# Patient Record
Sex: Female | Born: 1955 | Race: White | Hispanic: No | Marital: Married | Smoking: Never smoker
Health system: Southern US, Community
[De-identification: ages and names within clinical notes are randomized; demographics above are authoritative.]

## PROBLEM LIST (undated history)

## (undated) DIAGNOSIS — M4722 Other spondylosis with radiculopathy, cervical region: Secondary | ICD-10-CM

## (undated) DIAGNOSIS — R0789 Other chest pain: Secondary | ICD-10-CM

## (undated) DIAGNOSIS — IMO0002 Reserved for concepts with insufficient information to code with codable children: Secondary | ICD-10-CM

## (undated) DIAGNOSIS — G43709 Chronic migraine without aura, not intractable, without status migrainosus: Secondary | ICD-10-CM

## (undated) HISTORY — DX: Reserved for concepts with insufficient information to code with codable children: IMO0002

## (undated) HISTORY — DX: Other chest pain: R07.89

## (undated) HISTORY — DX: Other spondylosis with radiculopathy, cervical region: M47.22

## (undated) HISTORY — DX: Chronic migraine without aura, not intractable, without status migrainosus: G43.709

---

## 2003-07-16 ENCOUNTER — Other Ambulatory Visit: Admission: RE | Admit: 2003-07-16 | Discharge: 2003-07-16 | Payer: Self-pay | Admitting: Obstetrics and Gynecology

## 2004-07-20 ENCOUNTER — Other Ambulatory Visit: Admission: RE | Admit: 2004-07-20 | Discharge: 2004-07-20 | Payer: Self-pay | Admitting: Obstetrics and Gynecology

## 2005-05-10 ENCOUNTER — Encounter: Admission: RE | Admit: 2005-05-10 | Discharge: 2005-05-10 | Payer: Self-pay | Admitting: Family Medicine

## 2005-09-13 ENCOUNTER — Other Ambulatory Visit: Admission: RE | Admit: 2005-09-13 | Discharge: 2005-09-13 | Payer: Self-pay | Admitting: Obstetrics and Gynecology

## 2007-07-05 ENCOUNTER — Ambulatory Visit: Payer: Self-pay | Admitting: Specialist

## 2007-07-05 ENCOUNTER — Ambulatory Visit: Payer: Self-pay | Admitting: Critical Care Medicine

## 2007-07-30 ENCOUNTER — Ambulatory Visit: Payer: Self-pay | Admitting: Pulmonary Disease

## 2016-04-30 ENCOUNTER — Encounter (INDEPENDENT_AMBULATORY_CARE_PROVIDER_SITE_OTHER): Payer: Self-pay

## 2016-04-30 DIAGNOSIS — G43709 Chronic migraine without aura, not intractable, without status migrainosus: Secondary | ICD-10-CM | POA: Insufficient documentation

## 2016-04-30 DIAGNOSIS — IMO0002 Reserved for concepts with insufficient information to code with codable children: Secondary | ICD-10-CM | POA: Insufficient documentation

## 2016-07-04 ENCOUNTER — Ambulatory Visit (INDEPENDENT_AMBULATORY_CARE_PROVIDER_SITE_OTHER): Payer: Managed Care, Other (non HMO) | Admitting: Sports Medicine

## 2016-07-04 ENCOUNTER — Encounter (INDEPENDENT_AMBULATORY_CARE_PROVIDER_SITE_OTHER): Payer: Self-pay | Admitting: Sports Medicine

## 2016-07-04 VITALS — BP 133/84 | HR 64

## 2016-07-04 DIAGNOSIS — R030 Elevated blood-pressure reading, without diagnosis of hypertension: Secondary | ICD-10-CM | POA: Diagnosis not present

## 2016-07-04 DIAGNOSIS — M4722 Other spondylosis with radiculopathy, cervical region: Secondary | ICD-10-CM | POA: Diagnosis not present

## 2016-07-04 DIAGNOSIS — M25511 Pain in right shoulder: Secondary | ICD-10-CM | POA: Diagnosis not present

## 2016-07-04 DIAGNOSIS — G8929 Other chronic pain: Secondary | ICD-10-CM

## 2016-07-04 MED ORDER — OXYCODONE-ACETAMINOPHEN 5-325 MG PO TABS: 1.0000 | ORAL_TABLET | Freq: Four times a day (QID) | ORAL | 0 refills | Status: DC | PRN

## 2016-07-04 NOTE — Patient Instructions (Signed)
It was good to see you. Keep up with the exercises. Please check your BP at least once a week and write down the numbers and please bring them with you to your next appointment.

## 2016-07-04 NOTE — Progress Notes (Signed)
   Office Visit Note   Patient: Amanda Mitchell           Date of Birth: [birth year]           MRN: [id number] Visit Date: 07/04/2016              Requested by: No referring provider defined for this encounter. PCP: Juluis MireMCCOMB,JOHN S, MD  Assessment & Plan: Visit Diagnoses:  1. Elevated BP without diagnosis of hypertension   2. Cervical spondylosis with radiculopathy   3. Chronic right shoulder pain    Plan: Meds refilled today. No red flag symptoms. Continue HEP. Home BP checks.   Follow-Up Instructions: Return in about 3 months (around 10/04/2016) for Medication check and refills.   Orders:  Meds ordered this encounter  Medications  . oxyCODONE-acetaminophen (PERCOCET/ROXICET) 5-325 MG tablet    Sig: Take 1 tablet by mouth every 6 (six) hours as needed for severe pain.    Dispense:  60 tablet    Refill:  0    Procedures: No procedures performed   Historical Data: Findings:  Husband - Grayce SessionsMichael Travels extensively for work. Uses neck brace while flying with good control of symptoms.  Cervical Spondylosis multi-level on prior X-rays. No red flags symptoms in the past. Does well on intermittent Percocet - #60 q 3 months. Have discussed that we will not be increasing doses without referral to pain management. Chronic "Hormone Related" Headaches improved on chronic Progesterone supplementation. Otherwise healthy.  Subjective: Chief Complaint  Patient presents with  . Follow-up    Right arm and Neck   Overall doing well.  Neck pain and headaches are improved and no longer bothering her as intensely as they previously were.  No night time disturbances due to this. No pain going down past her upper shoulders.  Running 6 miles per week without worsening back or leg symptoms.  On chronic Progesterone replacement which reportedly helps headaches.   BP is elevated today without associated headaches. Pt denies chest pain, palpitations, shortness of breath/DOE, orthopnea/PND, LE swelling.       Review of Systems  Constitutional: Negative.        Otherwise per HPI   Objective: Vital Signs: BP 133/84   Pulse 64   Physical Exam  Constitutional: She appears well-developed and well-nourished. No distress.  HENT:  Head: Normocephalic and atraumatic.  Pulmonary/Chest: Effort normal. No respiratory distress.  Musculoskeletal:  Somewhat limited cervical side bending & rotation but otherwise full overhead range of motion of shoulders. No significant tenderness to palpation.  Neurological: She is alert.  Appropriately interactive.  Skin: Skin is warm and dry. No rash noted. She is not diaphoretic. No erythema. No pallor.  Psychiatric: Judgment normal.   Ortho Exam  Specialty Comments:  No specialty comments available.  Imaging: No results found.   PMFS History: Patient Active Problem List   Diagnosis Date Noted  . Cervical spondylosis with radiculopathy 07/04/2016  . Shoulder pain, right 07/04/2016  . Chronic migraine 04/30/2016   No past medical history on file.  Family History  Problem Relation Age of Onset  . Congestive Heart Failure Mother     No past surgical history on file. Social History   Occupational History  . Not on file.   Social History Main Topics  . Smoking status: Never Smoker  . Smokeless tobacco: Never Used  . Alcohol use Not on file  . Drug use: Unknown  . Sexual activity: Not on file

## 2016-07-08 ENCOUNTER — Other Ambulatory Visit (INDEPENDENT_AMBULATORY_CARE_PROVIDER_SITE_OTHER): Payer: Self-pay | Admitting: Sports Medicine

## 2016-07-08 ENCOUNTER — Encounter (INDEPENDENT_AMBULATORY_CARE_PROVIDER_SITE_OTHER): Payer: Self-pay | Admitting: Sports Medicine

## 2016-07-08 NOTE — Telephone Encounter (Signed)
Rx refill request

## 2016-07-11 NOTE — Telephone Encounter (Signed)
I assume you are more so referencing the health maintenance. This information will be updated as we continue seeing you.  If you have dates and any documentation from those encounters I am happy to enter the updated information. The chart is not completely encompassing at this time but will continue to evolve and become more complete as we transition to this system.  Thanks.  Dr. Berline Choughigby

## 2016-08-09 ENCOUNTER — Telehealth (INDEPENDENT_AMBULATORY_CARE_PROVIDER_SITE_OTHER): Payer: Self-pay | Admitting: Sports Medicine

## 2016-08-09 DIAGNOSIS — R0789 Other chest pain: Secondary | ICD-10-CM

## 2016-08-09 NOTE — Telephone Encounter (Signed)
Patient wanted to know if she should take a stress test due to the fact that she has been having fatigue,sob, odd sensation in arms.

## 2016-08-11 ENCOUNTER — Encounter (INDEPENDENT_AMBULATORY_CARE_PROVIDER_SITE_OTHER): Payer: Self-pay | Admitting: Sports Medicine

## 2016-08-11 NOTE — Telephone Encounter (Signed)
I called and spoke with patient over the phone. She states this is the best contact number for her she is only unable to answer if she is in a meeting. I advised her you would be contacting her to discuss the possibility of stress test. [phone] [facility])

## 2016-08-11 NOTE — Telephone Encounter (Signed)
I called the patient's telephone number however is reporting that is been disconnected & no longer in service. We can consider referral to cardiology for stress test given the symptoms that she is describing. I'm happy to place disorder but do want to discuss this with her further. If she calls back please verify that her telephone number at that time. I'll try to also send her to my chart message.

## 2016-08-12 NOTE — Addendum Note (Signed)
Addended by: Gaspar BiddingIGBY, Caydn Justen D on: 08/12/2016 01:13 PM   Modules accepted: Orders

## 2016-08-12 NOTE — Telephone Encounter (Signed)
I called & spoke with patient regarding the symptoms she described. She has been having generalize fatigue over the past several months as well as to single episodes of generalized arm & chest heaviness with an inability to remain standing while working at her desk at work. She denies any syncope or presyncope with exertion & continues to run several miles for exercise without significant symptoms.   Her mother has a history of congestive heart failure.   Amanda Mitchell's blood pressures have typically run in the 120s over high 80s & she is never required any antihypertensive medications. [facility] All of her most recent lab tests are from outside sources & she is just established with me as her primary care patient although I've been taking care of many of her musculoskeletal complaints for the past year.  I would like to refer her to cardiology for them to perform further risk stratification including EKG, consideration of stress test & would appreciate them obtaining lab work at that time as I will be transitioning to my new practice at [facility]. Order has been placed.

## 2016-09-14 NOTE — Telephone Encounter (Signed)
Yes this was addressed with the patient at her last follow-up

## 2016-09-26 ENCOUNTER — Ambulatory Visit (INDEPENDENT_AMBULATORY_CARE_PROVIDER_SITE_OTHER): Payer: Managed Care, Other (non HMO) | Admitting: Sports Medicine

## 2016-09-28 ENCOUNTER — Encounter: Payer: Self-pay | Admitting: Sports Medicine

## 2016-09-30 ENCOUNTER — Ambulatory Visit: Payer: Self-pay | Admitting: Cardiology

## 2016-09-30 NOTE — Progress Notes (Deleted)
Cardiology Office Note   Date:  09/30/2016   ID:  Amanda FairyHelen Vargo, DOB 1956/02/22, MRN [id number]  PCP:  Juluis MireMCCOMB,JOHN S, MD  Cardiologist:   Rollene RotundaJames Zhara Gieske, MD  Referring:  ***  No chief complaint on file.     History of Present Illness: Amanda Mitchell is a 61 y.o. female who presents for ***    No past medical history on file.  No past surgical history on file.   Current Outpatient Prescriptions  Medication Sig Dispense Refill  . amitriptyline (ELAVIL) 25 MG tablet TAKE 1 TABLET BY MOUTH EVERY NIGHT AT BEDTIME 30 tablet 1  . Diclofenac Sodium (PENNSAID) 2 % SOLN Place 1 application onto the skin.    Marland Kitchen. oxyCODONE-acetaminophen (PERCOCET/ROXICET) 5-325 MG tablet Take 1 tablet by mouth every 6 (six) hours as needed for severe pain. 60 tablet 0  . progesterone (PROMETRIUM) 100 MG capsule Take 100 mg by mouth daily.     No current facility-administered medications for this visit.     Allergies:   Patient has no known allergies.    Social History:  The patient  reports that she has never smoked. She has never used smokeless tobacco.   Family History:  The patient's ***family history includes Congestive Heart Failure in her mother.    ROS:  Please see the history of present illness.   Otherwise, review of systems are positive for {NONE DEFAULTED:18576::"none"}.   All other systems are reviewed and negative.    PHYSICAL EXAM: VS:  There were no vitals taken for this visit. , BMI There is no height or weight on file to calculate BMI. GENERAL:  Well appearing HEENT:  Pupils equal round and reactive, fundi not visualized, oral mucosa unremarkable NECK:  No jugular venous distention, waveform within normal limits, carotid upstroke brisk and symmetric, no bruits, no thyromegaly LYMPHATICS:  No cervical, inguinal adenopathy LUNGS:  Clear to auscultation bilaterally BACK:  No CVA tenderness CHEST:  Unremarkable HEART:  PMI not displaced or sustained,S1 and S2 within normal  limits, no S3, no S4, no clicks, no rubs, *** murmurs ABD:  Flat, positive bowel sounds normal in frequency in pitch, no bruits, no rebound, no guarding, no midline pulsatile mass, no hepatomegaly, no splenomegaly EXT:  2 plus pulses throughout, no edema, no cyanosis no clubbing SKIN:  No rashes no nodules NEURO:  Cranial nerves II through XII grossly intact, motor grossly intact throughout PSYCH:  Cognitively intact, oriented to person place and time    EKG:  EKG {ACTION; IS/IS MVH:[id number]}OT:[id number]} ordered today. The ekg ordered today demonstrates ***   Recent Labs: No results found for requested labs within last 8760 hours.    Lipid Panel No results found for: CHOL, TRIG, HDL, CHOLHDL, VLDL, LDLCALC, LDLDIRECT    Wt Readings from Last 3 Encounters:  04/30/16 125 lb (56.7 kg)      Other studies Reviewed: Additional studies/ records that were reviewed today include: ***. Review of the above records demonstrates:  Please see elsewhere in the note.  ***   ASSESSMENT AND PLAN:  ***   Current medicines are reviewed at length with the patient today.  The patient {ACTIONS; HAS/DOES NOT HAVE:19233} concerns regarding medicines.  The following changes have been made:  {PLAN; NO CHANGE:13088:s}  Labs/ tests ordered today include: *** No orders of the defined types were placed in this encounter.    Disposition:   FU with ***    Signed, Rollene RotundaJames Omir Cooprider, MD  09/30/2016 7:31 AM  [facility]

## 2016-10-24 ENCOUNTER — Ambulatory Visit (INDEPENDENT_AMBULATORY_CARE_PROVIDER_SITE_OTHER): Payer: Managed Care, Other (non HMO) | Admitting: Sports Medicine

## 2016-10-24 ENCOUNTER — Encounter: Payer: Self-pay | Admitting: Sports Medicine

## 2016-10-24 ENCOUNTER — Other Ambulatory Visit: Payer: Self-pay | Admitting: Sports Medicine

## 2016-10-24 ENCOUNTER — Ambulatory Visit: Payer: Self-pay | Admitting: Sports Medicine

## 2016-10-24 ENCOUNTER — Encounter: Payer: Self-pay | Admitting: *Deleted

## 2016-10-24 VITALS — BP 122/84 | HR 55 | Ht 63.0 in | Wt 128.6 lb

## 2016-10-24 DIAGNOSIS — Z119 Encounter for screening for infectious and parasitic diseases, unspecified: Secondary | ICD-10-CM | POA: Diagnosis not present

## 2016-10-24 DIAGNOSIS — M4722 Other spondylosis with radiculopathy, cervical region: Secondary | ICD-10-CM | POA: Diagnosis not present

## 2016-10-24 DIAGNOSIS — Z0000 Encounter for general adult medical examination without abnormal findings: Secondary | ICD-10-CM | POA: Diagnosis not present

## 2016-10-24 DIAGNOSIS — R5382 Chronic fatigue, unspecified: Secondary | ICD-10-CM | POA: Diagnosis not present

## 2016-10-24 DIAGNOSIS — Z23 Encounter for immunization: Secondary | ICD-10-CM | POA: Diagnosis not present

## 2016-10-24 LAB — CBC WITH DIFFERENTIAL/PLATELET
BASOS PCT: 0.6 % (ref 0.0–3.0)
Basophils Absolute: 0 10*3/uL (ref 0.0–0.1)
EOS ABS: 0.2 10*3/uL (ref 0.0–0.7)
Eosinophils Relative: 4.6 % (ref 0.0–5.0)
HCT: 41.4 % (ref 36.0–46.0)
HEMOGLOBIN: 14.2 g/dL (ref 12.0–15.0)
Lymphocytes Relative: 32.4 % (ref 12.0–46.0)
Lymphs Abs: 1.3 10*3/uL (ref 0.7–4.0)
MCHC: 34.4 g/dL (ref 30.0–36.0)
MCV: 96 fl (ref 78.0–100.0)
MONO ABS: 0.4 10*3/uL (ref 0.1–1.0)
Monocytes Relative: 8.6 % (ref 3.0–12.0)
Neutro Abs: 2.2 10*3/uL (ref 1.4–7.7)
Neutrophils Relative %: 53.8 % (ref 43.0–77.0)
Platelets: 238 10*3/uL (ref 150.0–400.0)
RBC: 4.31 Mil/uL (ref 3.87–5.11)
RDW: 12 % (ref 11.5–15.5)
WBC: 4.1 10*3/uL (ref 4.0–10.5)

## 2016-10-24 LAB — COMPREHENSIVE METABOLIC PANEL
ALBUMIN: 4.6 g/dL (ref 3.5–5.2)
ALK PHOS: 67 U/L (ref 39–117)
ALT: 14 U/L (ref 0–35)
AST: 24 U/L (ref 0–37)
BUN: 15 mg/dL (ref 6–23)
CO2: 28 mEq/L (ref 19–32)
CREATININE: 0.78 mg/dL (ref 0.40–1.20)
Calcium: 8.8 mg/dL (ref 8.4–10.5)
Chloride: 101 mEq/L (ref 96–112)
GFR: 80.23 mL/min (ref >60.00)
Glucose, Bld: 91 mg/dL (ref 70–99)
Potassium: 4.5 mEq/L (ref 3.5–5.1)
SODIUM: 138 meq/L (ref 135–145)
TOTAL PROTEIN: 7.3 g/dL (ref 6.0–8.3)
Total Bilirubin: 0.8 mg/dL (ref 0.2–1.2)

## 2016-10-24 LAB — TSH: TSH: 1.87 u[IU]/mL (ref 0.35–4.50)

## 2016-10-24 MED ORDER — OXYCODONE-ACETAMINOPHEN 5-325 MG PO TABS: 1.0000 | ORAL_TABLET | Freq: Four times a day (QID) | ORAL | 0 refills | Status: DC | PRN

## 2016-10-24 NOTE — Assessment & Plan Note (Signed)
Updated health maintenance today.  She is up-to-date on all her preventative maintenance as of today's visit.

## 2016-10-24 NOTE — Patient Instructions (Signed)
Check out the [employer]le on [employer] Got[employer]huhttps://www.amazon.com/gp/product/B005QBCL82/ref=od_aui_detailpages00?ie=UTF8&psc=1

## 2016-10-24 NOTE — Assessment & Plan Note (Signed)
Patient has had issues with fatigue that has been progressive especially during exercise over the past year. She has a referral to cardiology that she is rescheduling for consideration of exercise stress test.   We'll check TSH today.

## 2016-10-24 NOTE — Assessment & Plan Note (Signed)
Patient is on chronic intermittent opioid therapy.  #60 per 3 months.  This has worked well for her and she seems to be taking it appropriately. Controlled substance contract signed today.  UDS deferred

## 2016-10-24 NOTE — Progress Notes (Signed)
Amanda Mitchell - 61 y.o. female MRN [id number]  Date of birth: 01-31-1956  Office Visit Note: Visit Date: 10/24/2016 PCP: Juluis Mire, MD Referred by: Richardean Chimera, MD  Subjective: Chief Complaint  Patient presents with  . Follow-up    Pt here to f/u on neck pain. She says that her neck pain has been flaring up recently. Pt has biometric screening form that needs to be completed.    HPI: Patient reports that her neck pain has continued to be intermittently bothersome for her.  She has been weaned off of her replacement therapy and is at a slight increase in the headaches that she has.  These medications were to help address these headaches.  Overall coping quite well.  She continues to take prescribed Percocet intermittently and tends to only use them for 2-3 days at a time.  She has continued to remain active but does have some fatigue especially earlier in 2 running that she did in the past.  She has an upcoming cardiology appointment. ROS: Pt denies chest pain, palpitations, shortness of breath/DOE, orthopnea/PND, LE swelling.  Patient denies any facial asymmetry, unilateral weakness, or dysarthria.   Otherwise per HPI.  Objective:  VS:  HT:5\' 3"  (160 cm)   WT:128 lb 9.6 oz (58.3 kg)  BMI:22.8    BP:122/84  HR:(!) 55bpm  TEMP: ( )  RESP:97 %   Physical Exam: Adult female. Alert and appropriate.  In no acute distress.  Upper extremities are overall well aligned with no significant deformity. No significant swelling.  Distal pulses 2+/4. No significant bruising/ecchymosis or erythema the skin Neck: Overall well aligned.  Minimal side bending to the left.  Paraspinal muscle spasms most focally at the occipital region.  No midline tenderness.  Negative Spurling's.  Thyroid is normal size with no palpable nodule. Heart: Regular rate and rhythm, S1 and S2 heard, no murmur. Lungs: Clear to auscultation bilaterally  FOLLOW UP:  Return in about 3 months (around 01/21/2017).   Imaging  & Procedures: No results found.   Assessment & Plan: Visit Diagnoses:  Problem List Items Addressed This Visit    Cervical spondylosis with radiculopathy    Patient is on chronic intermittent opioid therapy.  #60 per 3 months.  This has worked well for her and she seems to be taking it appropriately. Controlled substance contract signed today.  UDS deferred      Relevant Medications   oxyCODONE-acetaminophen (PERCOCET/ROXICET) 5-325 MG tablet   Chronic fatigue    Patient has had issues with fatigue that has been progressive especially during exercise over the past year. She has a referral to cardiology that she is rescheduling for consideration of exercise stress test.   We'll check TSH today.      Relevant Orders   TSH   Preventative health care    Updated health maintenance today.  She is up-to-date on all her preventative maintenance as of today's visit.      Relevant Orders   Comprehensive metabolic panel   CBC with Differential/Platelet   Lipid Panel With LDL/HDL Ratio    Other Visit Diagnoses    Screening examination for infectious disease    -  Primary   Relevant Orders   HIV antibody (with reflex)   Hepatitis C antibody, reflex   Need for Tdap vaccination       Relevant Orders   Tdap vaccine greater than or equal to 7yo IM (Completed)   Need for influenza vaccination  Relevant Orders   Flu Vaccine QUAD 36+ mos PF IM (Fluarix & Fluzone Quad PF) (Completed)     Follow-up: Return in about 3 months (around 01/21/2017).   Past Medical/Family/Surgical/Social History: Medications & Allergies reviewed per EMR Patient Active Problem List   Diagnosis Date Noted  . Chronic fatigue 10/24/2016  . Preventative health care 10/24/2016  . Cervical spondylosis with radiculopathy 07/04/2016  . Shoulder pain, right 07/04/2016  . Chronic migraine 04/30/2016   History reviewed. No pertinent past medical history. Family History  Problem Relation Age of Onset  .  Congestive Heart Failure Mother    History reviewed. No pertinent surgical history. Social History   Occupational History  . Not on file.   Social History Main Topics  . Smoking status: Never Smoker  . Smokeless tobacco: Never Used  . Alcohol use Not on file  . Drug use: Unknown  . Sexual activity: Not on file

## 2016-10-25 LAB — LIPID PANEL WITH LDL/HDL RATIO
Cholesterol, Total: 209 mg/dL — ABNORMAL HIGH (ref 100–199)
HDL: 74 mg/dL (ref >39)
LDL CALC: 107 mg/dL — AB (ref 0–99)
LDl/HDL Ratio: 1.4 ratio units (ref 0.0–3.2)
Triglycerides: 140 mg/dL (ref 0–149)
VLDL Cholesterol Cal: 28 mg/dL (ref 5–40)

## 2016-10-25 LAB — HIV ANTIBODY (ROUTINE TESTING W REFLEX): HIV 1&2 Ab, 4th Generation: NONREACTIVE

## 2016-10-25 LAB — HEPATITIS C ANTIBODY: HCV AB: NEGATIVE

## 2016-10-29 ENCOUNTER — Encounter: Payer: Self-pay | Admitting: Sports Medicine

## 2016-12-09 ENCOUNTER — Encounter: Payer: Self-pay | Admitting: Sports Medicine

## 2016-12-13 MED ORDER — FLUTICASONE PROPIONATE 50 MCG/ACT NA SUSP: 2.0000 | Freq: Every day | NASAL | 12 refills | Status: DC

## 2017-01-10 DIAGNOSIS — R0789 Other chest pain: Secondary | ICD-10-CM | POA: Insufficient documentation

## 2017-01-20 ENCOUNTER — Ambulatory Visit: Payer: Managed Care, Other (non HMO) | Admitting: Cardiology

## 2017-01-25 NOTE — Progress Notes (Signed)
Cardiology Office Note   Date:  01/26/2017   ID:  Amanda Mitchell, DOB June 01, 1956, MRN [id number]  PCP:  Amanda Chimera, MD  Cardiologist:   Amanda Rotunda, MD  Referring:  Amanda Mews, DO  Chief Complaint  Patient presents with  . Shoulder Pain      History of Present Illness: Amanda Mitchell is a 60 y.o. female who is referred by Dr. Berline Mitchell for evaluation of shoulder pain.  The patient has no past cardiac history. She's had 3 episodes of arm heaviness and shoulder heaviness. She couldn't lift her arms. There was some mild associated shortness of breath. These happened sporadically at rest. They don't last very long but they worry her as she does have some family history. She's able to be very active. She runs and swims 4 times per week. With this she does not bring on any symptoms. The patient denies any new symptoms such as chest discomfort, neck or arm discomfort. There has been no new shortness of breath, PND or orthopnea. There have been no reported palpitations, presyncope or syncope.  Past Medical History:  Diagnosis Date  . Cervical spondylosis with radiculopathy   . Chest tightness   . Chronic migraine     Past Surgical History:  Procedure Laterality Date  . CESAREAN SECTION  1992     Current Outpatient Prescriptions  Medication Sig Dispense Refill  . amitriptyline (ELAVIL) 25 MG tablet TAKE 1 TABLET BY MOUTH EVERY NIGHT AT BEDTIME 30 tablet 1  . fluticasone (FLONASE) 50 MCG/ACT nasal spray Place 2 sprays into both nostrils daily. 16 g 12  . oxyCODONE-acetaminophen (PERCOCET/ROXICET) 5-325 MG tablet Take 1 tablet by mouth every 6 (six) hours as needed for severe pain. 60 tablet 0   No current facility-administered medications for this visit.     Allergies:   Patient has no known allergies.    Social History:  The patient  reports that she has never smoked. She has never used smokeless tobacco. She reports that she does not drink alcohol or use drugs.   Family  History:  The patient's family history includes Cancer in her maternal grandmother and paternal grandfather; Congestive Heart Failure (age of onset: 10) in her mother; Heart attack (age of onset: 68) in her maternal grandfather; Hypertension in her maternal grandfather, maternal grandmother, and mother.    ROS:  Please see the history of present illness.   Otherwise, review of systems are positive for none.   All other systems are reviewed and negative.    PHYSICAL EXAM: VS:  BP 122/88   Pulse 69   Ht 5\' 3"  (1.6 m)   Wt 126 lb (57.2 kg)   BMI 22.32 kg/m  , BMI Body mass index is 22.32 kg/m. GENERAL:  Well appearing HEENT:  Pupils equal round and reactive, fundi not visualized, oral mucosa unremarkable NECK:  No jugular venous distention, waveform within normal limits, carotid upstroke brisk and symmetric, no bruits, no thyromegaly LYMPHATICS:  No cervical, inguinal adenopathy LUNGS:  Clear to auscultation bilaterally BACK:  No CVA tenderness CHEST:  Unremarkable HEART:  PMI not displaced or sustained,S1 and S2 within normal limits, no S3, no S4, no clicks, no rubs, no murmurs ABD:  Flat, positive bowel sounds normal in frequency in pitch, no bruits, no rebound, no guarding, no midline pulsatile mass, no hepatomegaly, no splenomegaly EXT:  2 plus pulses throughout, no edema, no cyanosis no clubbing SKIN:  No rashes no nodules NEURO:  Cranial nerves II through  XII grossly intact, motor grossly intact throughout PSYCH:  Cognitively intact, oriented to person place and time    EKG:  EKG is ordered today. The ekg ordered today demonstrates sinus rhythm, rate 69, axis within normal limits, intervals within normal limits, borderline low voltage.   Recent Labs: 10/24/2016: ALT 14; BUN 15; Creatinine, Ser 0.78; Hemoglobin 14.2; Platelets 238.0; Potassium 4.5; Sodium 138; TSH 1.87    Lipid Panel    Component Value Date/Time   CHOL 209 (H) 10/24/2016 0921   TRIG 140 10/24/2016 0921    HDL 74 10/24/2016 0921   LDLCALC 107 (H) 10/24/2016 0921      Wt Readings from Last 3 Encounters:  01/26/17 125 lb 12.8 oz (57.1 kg)  01/26/17 126 lb (57.2 kg)  10/24/16 128 lb 9.6 oz (58.3 kg)      Other studies Reviewed: Additional studies/ records that were reviewed today include: labs. Review of the above records demonstrates:  Please see elsewhere in the note.     ASSESSMENT AND PLAN:  SHOULDER PAIN:  The patient's symptoms are somewhat atypical but she does have risk factors. I think the probability of obstructive coronary disease is low. I'll start with a coronary calcium score and further management will be based on this.  SNORING  The patient has some fatigue and some restless sleep.  Dr. Berline Choughigby is going to consider a sleep study.     Current medicines are reviewed at length with the patient today.  The patient does not have concerns regarding medicines.  The following changes have been made:  no change  Labs/ tests ordered today include:   Orders Placed This Encounter  Procedures  . CT CARDIAC SCORING  . EKG 12-Lead     Disposition:   FU with me as needed.     Signed, Amanda RotundaJames Bresha Hosack, MD  01/26/2017 12:47 PM    [facility]

## 2017-01-26 ENCOUNTER — Ambulatory Visit (INDEPENDENT_AMBULATORY_CARE_PROVIDER_SITE_OTHER): Payer: Managed Care, Other (non HMO) | Admitting: Cardiology

## 2017-01-26 ENCOUNTER — Ambulatory Visit (INDEPENDENT_AMBULATORY_CARE_PROVIDER_SITE_OTHER): Payer: Managed Care, Other (non HMO) | Admitting: Sports Medicine

## 2017-01-26 ENCOUNTER — Encounter: Payer: Self-pay | Admitting: Cardiology

## 2017-01-26 ENCOUNTER — Encounter: Payer: Self-pay | Admitting: Sports Medicine

## 2017-01-26 VITALS — BP 122/78 | HR 66 | Ht 63.0 in | Wt 125.8 lb

## 2017-01-26 VITALS — BP 122/88 | HR 69 | Ht 63.0 in | Wt 126.0 lb

## 2017-01-26 DIAGNOSIS — M25511 Pain in right shoulder: Secondary | ICD-10-CM | POA: Diagnosis not present

## 2017-01-26 DIAGNOSIS — R5382 Chronic fatigue, unspecified: Secondary | ICD-10-CM | POA: Diagnosis not present

## 2017-01-26 DIAGNOSIS — G8929 Other chronic pain: Secondary | ICD-10-CM

## 2017-01-26 DIAGNOSIS — M25512 Pain in left shoulder: Secondary | ICD-10-CM | POA: Diagnosis not present

## 2017-01-26 DIAGNOSIS — M4722 Other spondylosis with radiculopathy, cervical region: Secondary | ICD-10-CM | POA: Diagnosis not present

## 2017-01-26 MED ORDER — OXYCODONE-ACETAMINOPHEN 5-325 MG PO TABS: 1.0000 | ORAL_TABLET | Freq: Four times a day (QID) | ORAL | 0 refills | Status: DC | PRN

## 2017-01-26 NOTE — Patient Instructions (Signed)
Medication Instructions:  Continue current medications  Labwork: None Ordered  Testing/Procedures: Your physician has requested that you have a Coronary Calcium Score Test. This test is done at our [address] Office.   Follow-Up: Your physician recommends that you schedule a follow-up appointment in: As Needed   Any Other Special Instructions Will Be Listed Below (If Applicable).   If you need a refill on your cardiac medications before your next appointment, please call your pharmacy.   
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
## 2017-02-01 ENCOUNTER — Ambulatory Visit (INDEPENDENT_AMBULATORY_CARE_PROVIDER_SITE_OTHER)
Admission: RE | Admit: 2017-02-01 | Discharge: 2017-02-01 | Disposition: A | Discharge status: Home / Self Care | Payer: Self-pay | Source: Ambulatory Visit | Attending: Cardiology | Admitting: Cardiology

## 2017-02-01 ENCOUNTER — Encounter: Payer: Self-pay | Admitting: Radiology

## 2017-02-01 DIAGNOSIS — M25511 Pain in right shoulder: Secondary | ICD-10-CM

## 2017-02-01 DIAGNOSIS — M25512 Pain in left shoulder: Secondary | ICD-10-CM

## 2017-02-07 ENCOUNTER — Encounter: Payer: Self-pay | Admitting: Sports Medicine

## 2017-02-12 NOTE — Assessment & Plan Note (Signed)
Patient has had overall some improvement in her fatigue that is continuing to have symptoms.  Agree with CT calcium score given otherwise negative risk factors and atypical symptoms for cardiac disease.

## 2017-02-12 NOTE — Assessment & Plan Note (Signed)
Refill today of her Percocet.  She does continue to use appropriate use.  She is under chronic pain contract.  No urine drug screen given intermittent use indicated.

## 2017-02-12 NOTE — Assessment & Plan Note (Signed)
Given findings on prior x-rays [facility] and available in canopy system this is likely coming from cervical spondylosis.  Discussed using a neck pillow while traveling as well as considering over-the-counter cervical traction devices.  She should continue with the previously provided therapeutic exercises.

## 2017-04-28 ENCOUNTER — Ambulatory Visit: Payer: Managed Care, Other (non HMO) | Admitting: Sports Medicine

## 2017-05-05 ENCOUNTER — Telehealth: Payer: Self-pay

## 2017-05-05 ENCOUNTER — Encounter: Payer: Self-pay | Admitting: Sports Medicine

## 2017-05-05 ENCOUNTER — Ambulatory Visit (INDEPENDENT_AMBULATORY_CARE_PROVIDER_SITE_OTHER): Payer: Managed Care, Other (non HMO) | Admitting: Sports Medicine

## 2017-05-05 VITALS — BP 110/78 | HR 74 | Ht 63.0 in | Wt 121.0 lb

## 2017-05-05 DIAGNOSIS — M4722 Other spondylosis with radiculopathy, cervical region: Secondary | ICD-10-CM | POA: Diagnosis not present

## 2017-05-05 DIAGNOSIS — G894 Chronic pain syndrome: Secondary | ICD-10-CM | POA: Diagnosis not present

## 2017-05-05 DIAGNOSIS — M25511 Pain in right shoulder: Secondary | ICD-10-CM | POA: Diagnosis not present

## 2017-05-05 DIAGNOSIS — G8929 Other chronic pain: Secondary | ICD-10-CM

## 2017-05-05 MED ORDER — OXYCODONE-ACETAMINOPHEN 5-325 MG PO TABS: 1.0000 | ORAL_TABLET | Freq: Four times a day (QID) | ORAL | 0 refills | Status: DC | PRN

## 2017-05-05 NOTE — Assessment & Plan Note (Signed)
This has effectively completely resolved for her at this time.

## 2017-05-05 NOTE — Assessment & Plan Note (Signed)
She continues to have intermittent radiculitis symptoms that are mild in nature and tend to resolve spontaneously on their own.  These do seem to be positionally related.  She has no red flag symptoms.  She is well controlled on low dose, intermittent Percocet and this was refilled for her today.  She is under chronic pain contract.

## 2017-05-05 NOTE — Telephone Encounter (Signed)
Per Dr. Berline Chough, pt has tried amitriptyline, Voltaren, Steraped - therapeutic failure and neurologic side effects. PA initiated.

## 2017-05-05 NOTE — Progress Notes (Signed)
OFFICE VISIT NOTE Amanda Mitchell. [facility] [phone]  Amanda Mitchell - 61 y.o. female MRN [id number]  Date of birth: [birth year]  Visit Date: 05/05/2017  PCP: Richardean Chimera, MD   Referred by: Richardean Chimera, MD  Orlie Dakin, CMA acting as scribe for Dr. Berline Chough.  SUBJECTIVE:   Chief Complaint  Patient presents with  . Follow-up    neck pain, rt shoulder pain   HPI: As below and per problem based documentation when appropriate.  [name] is an established patient presenting today in follow-up of cervical spondylosis with radiculopathy and rt shoulder pain. She has been prescribed Percocet to take prn for pain. She was last seen 01/26/17 and advised to use a neck pillow when traveling, OTC cervical traction device, and to do home therapeutic exercises.   Pt reports that rt shoulder pain has completely resolved.   Pt reports continued neck pain, some days worse then others. She has more pain and stiffness on the left side of her neck than the right. Pain seems to be worse when sleeping, at times it will cause a HA. She has some pain when exercising. She has been doing home therapeutic exercises. She has also been doing more stretching and strength training since she retired. She takes a stretching class and there are certain things that they do that cause her more pain. She stops or modifies the exercise when the pain flares up. She does have radiation of pain/stiffness into the upper back near the scapula. She reports that when she is sleeping on her back she feels tingling in her fingers and her rt hand will fall asleep.     Review of Systems  Constitutional: Negative for chills and fever.  Respiratory: Negative for shortness of breath and wheezing.   Cardiovascular: Negative for chest pain and palpitations.  Musculoskeletal: Positive for neck pain. Negative for falls.  Neurological: Positive for tingling and headaches.  Negative for dizziness.  Endo/Heme/Allergies: Does not bruise/bleed easily.    Otherwise per HPI.  HISTORY & PERTINENT PRIOR DATA:  No specialty comments available. She reports that she has never smoked. She has never used smokeless tobacco. No results for input(s): HGBA1C, LABURIC in the last 8760 hours. Medications & Allergies reviewed per EMR Patient Active Problem List   Diagnosis Date Noted  . Chronic pain syndrome 05/05/2017  . Chest tightness   . Preventative health care 10/24/2016  . Cervical spondylosis with radiculopathy 07/04/2016  . Chronic migraine 04/30/2016   Past Medical History:  Diagnosis Date  . Cervical spondylosis with radiculopathy   . Chest tightness   . Chronic migraine    Family History  Problem Relation Age of Onset  . Congestive Heart Failure Mother 67  . Hypertension Mother   . Cancer Maternal Grandmother        Liver  . Hypertension Maternal Grandmother   . Heart attack Maternal Grandfather 65  . Hypertension Maternal Grandfather   . Cancer Paternal Grandfather    Past Surgical History:  Procedure Laterality Date  . CESAREAN SECTION  1992   Social History   Occupational History  . Not on file.   Social History Main Topics  . Smoking status: Never Smoker  . Smokeless tobacco: Never Used  . Alcohol use No  . Drug use: No  . Sexual activity: Not on file    OBJECTIVE:  VS:  HT:5\' 3"  (160 cm)   WT:121 lb (54.9 kg)  BMI:21.44  BP:110/78  HR:74bpm  TEMP: ( )  RESP:97 % EXAM: Findings:  WDWN, NAD, Non-toxic appearing Alert & appropriately interactive Not depressed or anxious appearing No increased work of breathing. Pupils are equal. EOM intact without nystagmus No clubbing or cyanosis of the extremities appreciated No significant rashes/lesions/ulcerations overlying the examined area. Radial pulses 2+/4.  No significant generalized UE edema. Sensation intact to light touch in upper extremities.  Bilateral upper extremities  overall well aligned.  Upper extremity strength is 5+/5 in all myotomes.  No significant dysesthesia.  Reflexes are symmetric.  Cervical range of motion is slightly limited with side bending and rotation but no pain with Spurling's compression test or Lhermitte's compression test.     No results found. ASSESSMENT & PLAN:     ICD-10-CM   1. Cervical spondylosis with radiculopathy M47.22 oxyCODONE-acetaminophen (PERCOCET/ROXICET) 5-325 MG tablet  2. Chronic pain syndrome G89.4   3. Chronic right shoulder pain M25.511    G89.29   ================================================================= Cervical spondylosis with radiculopathy She continues to have intermittent radiculitis symptoms that are mild in nature and tend to resolve spontaneously on their own.  These do seem to be positionally related.  She has no red flag symptoms.  She is well controlled on low dose, intermittent Percocet and this was refilled for her today.  She is under chronic pain contract.  Shoulder pain, right This has effectively completely resolved for her at this time. =================================================================  Follow-up: Return in about 3 months (around 08/05/2017).   CMA/ATC served as Neurosurgeon during this visit. History, Physical, and Plan performed by medical provider. Documentation and orders reviewed and attested to.      Gaspar Bidding, DO    [facility]

## 2017-05-05 NOTE — Telephone Encounter (Signed)
Insurance requiring PA for [employer]. Need to know what meds have been tried in the past. Called pt and left VM to call the office.

## 2017-05-08 NOTE — Telephone Encounter (Signed)
PA approved.

## 2017-05-11 NOTE — Telephone Encounter (Signed)
Spoke with patient and advised that PA had been taken care of, no additional information needed at this time. Pt verbalized understanding.

## 2017-05-11 NOTE — Telephone Encounter (Signed)
Per note in EPIC from 6 days ago, pt was called regarding information for PA for her medication. PA has been completed and medication was approved.

## 2017-05-11 NOTE — Telephone Encounter (Signed)
[place], no note in epic. Patient is aware that today is Dr. Berline Choughigby and staff's half day, call patient tomorrow to advise. Please document once this has been done.

## 2017-08-29 ENCOUNTER — Encounter: Payer: Self-pay | Admitting: Sports Medicine

## 2017-08-29 ENCOUNTER — Ambulatory Visit (INDEPENDENT_AMBULATORY_CARE_PROVIDER_SITE_OTHER): Payer: No Typology Code available for payment source | Admitting: Sports Medicine

## 2017-08-29 VITALS — BP 136/88 | HR 66 | Ht 63.0 in | Wt 121.0 lb

## 2017-08-29 DIAGNOSIS — M67442 Ganglion, left hand: Secondary | ICD-10-CM

## 2017-08-29 DIAGNOSIS — G894 Chronic pain syndrome: Secondary | ICD-10-CM | POA: Diagnosis not present

## 2017-08-29 DIAGNOSIS — R0789 Other chest pain: Secondary | ICD-10-CM | POA: Diagnosis not present

## 2017-08-29 DIAGNOSIS — M4722 Other spondylosis with radiculopathy, cervical region: Secondary | ICD-10-CM

## 2017-08-29 MED ORDER — OXYCODONE-ACETAMINOPHEN 5-325 MG PO TABS: 1.0000 | ORAL_TABLET | Freq: Four times a day (QID) | ORAL | 0 refills | Status: DC | PRN

## 2017-08-29 NOTE — Assessment & Plan Note (Signed)
This is essentially asymptomatic and an incidental finding at this time.  If any worsening symptoms can consider further diagnostic and therapeutic evaluation/management however at this time continue with watchful waiting.

## 2017-08-29 NOTE — Patient Instructions (Signed)
It was good to see you, you have a small ganglion on your finger if this worsens in any way I am happy to see you back to inject this but anticipate this is doing well.  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  If any worsening I am always happy to see you sooner but we will plan to see you back in 3-6 months.

## 2017-08-29 NOTE — Assessment & Plan Note (Addendum)
Refill provided today, will plan to follow-up in 3-6 months.  Okay for 1 additional refill of intermittent Percocet as long as it has been less than 6 months follow-up.   Chronic Pain Diagnosis  cervical spondylosis with radiculitis  Duration of Treatment  chronic, intermittent opioid  Etiology of Chronic Pain  chronic neck osteoarthritis  Character of Pain  tightness, muscle spasms and occasional burning    Radiation  none  Severity in Past 24o  moderate severity at worst   Therapies Tried Ineffective Effective     Intermittent opioids  Cervical traction  Heat and ice  Therapeutic exercises    Adverse Effects of Meds Nausea: Denies Vomiting: Denies Confusion: Denies Sleepiness: Denies Fatigue: Denies Constipation: Denies Other reported symptoms: none

## 2017-08-29 NOTE — Assessment & Plan Note (Signed)
This has improved and is resolved at this time.  She has been active and does not have any significant shortness of breath dizziness lightheadedness or chest pressure.

## 2017-08-29 NOTE — Progress Notes (Signed)
Veverly FellsMichael D. Delorise Shinerigby, DO  [facility] [place] [phone]  Amanda FairyHelen Bolotin - 61 y.o. female MRN [id number]  Date of birth: June 29, 1956  Visit Date: 08/29/2017  PCP: Richardean ChimeraMcComb, John, MD   Referred by: Richardean ChimeraMcComb, John, MD   Scribe for today's visit: Stevenson ClinchBrandy Coleman, CMA    SUBJECTIVE:  Amanda Mitchell is here for cervical spondylosis  Compared to the last office visit, her previously described symptoms show no change, sx are worse at night.  Current symptoms are varying, at times pain will be severe and cause HA and at other times mild. On average the pain is moderate. Pain is worse w/ ROM exercises. At times pain will radiate into the LT shoulder but not into the arm. She does notice occasional stiffness at the base of the skull when HA is present.  She has been taking Percocet as needed, using cervical traction device, and doing home exercises.    Pt c/o knot on left 3rd finger first noticed about 2 months ago. It isn't painful and doesn't interfere with her daily routine.    ROS Reports night time disturbances. Denies fevers, chills, or night sweats (hormonal night sweats 2-3 nights a week).  Denies unexplained weight loss. Denies personal history of cancer. Denies changes in bowel or bladder habits. Denies recent unreported falls. Denies new or worsening dyspnea or wheezing. Reports headaches or dizziness.  Reports numbness, tingling or weakness  In the extremities (numbness in 4th and 5th finger after sleeping on her back).   Denies dizziness or presyncopal episodes Denies lower extremity edema     HISTORY & PERTINENT PRIOR DATA:  Prior History reviewed and updated per electronic medical record.  Significant history, findings, studies and interim changes include:  reports that  has never smoked. she has never used smokeless tobacco. No results for input(s): HGBA1C, LABURIC, CREATINE in the last 8760 hours. No specialty comments  available. Problem  Ganglion Cyst of Flexor Tendon Sheath of Finger of Left Hand  Chest Tightness   Coronary calcium score of 0 based on cardiac calcium score CT on 02/03/2017.  Incidentally some mild right-sided interstitial inflammation/infection. No follow-up imaging recommended at this time.   Cervical Spondylosis With Radiculopathy     OBJECTIVE:  VS:  HT:5\' 3"  (160 cm)   WT:121 lb (54.9 kg)  BMI:21.44    BP:136/88  HR:66bpm  TEMP: ( )  RESP:98 %  PHYSICAL EXAM: Constitutional: WDWN, Non-toxic appearing. Psychiatric: Alert & appropriately interactive. Not depressed or anxious appearing. Respiratory: No increased work of breathing. Trachea Midline Eyes: Pupils are equal. EOM intact without nystagmus. No scleral icterus Cardiovascular:  Peripheral Pulses: peripheral pulses symmetrical No clubbing or cyanosis appreciated Capillary Refill is normal, less than 2 seconds No signficant generalized edema/anasarca Sensory Exam: intact to light touch  Upper extremities: Overall well aligned.  No significant deformity appreciated.  Left third finger does have a small amount of nodularity across the common flexor tendon that is nonpainful.  She has overall good range of motion of the finger without overt catching.  No significant pain over the A1 pulley. Finger abduction and adduction strength as well as finger flexion strength is intact and normal.  No significant pain with cervical range of motion although she is limited with forward flexion and cervical side bending/rotation although this is minimal.  ASSESSMENT & PLAN:   1. Ganglion cyst of flexor tendon sheath of finger of left hand   2. Chest tightness   3. Cervical spondylosis  with radiculopathy   4. Chronic pain syndrome    PLAN:    Cervical spondylosis with radiculopathy Refill provided today, will plan to follow-up in 3-6 months.  Okay for 1 additional refill of intermittent Percocet as long as it has been less than 6  months follow-up.   Chronic Pain Diagnosis  cervical spondylosis with radiculitis  Duration of Treatment  chronic, intermittent opioid  Etiology of Chronic Pain  chronic neck osteoarthritis  Character of Pain  tightness, muscle spasms and occasional burning    Radiation  none  Severity in Past 24o  moderate severity at worst   Therapies Tried Ineffective Effective     Intermittent opioids  Cervical traction  Heat and ice  Therapeutic exercises    Adverse Effects of Meds Nausea: Denies Vomiting: Denies Confusion: Denies Sleepiness: Denies Fatigue: Denies Constipation: Denies Other reported symptoms: none    Ganglion cyst of flexor tendon sheath of finger of left hand This is essentially asymptomatic and an incidental finding at this time.  If any worsening symptoms can consider further diagnostic and therapeutic evaluation/management however at this time continue with watchful waiting.  Chest tightness This has improved and is resolved at this time.  She has been active and does not have any significant shortness of breath dizziness lightheadedness or chest pressure.   ++++++++++++++++++++++++++++++++++++++++++++ Orders & Meds: No orders of the defined types were placed in this encounter.   Meds ordered this encounter  Medications  . oxyCODONE-acetaminophen (PERCOCET/ROXICET) 5-325 MG tablet    Sig: Take 1 tablet by mouth every 6 (six) hours as needed for severe pain.    Dispense:  60 tablet    Refill:  0    ++++++++++++++++++++++++++++++++++++++++++++ Follow-up: Return in about 3 months (around 11/27/2017).   Pertinent documentation may be included in additional procedure notes, imaging studies, problem based documentation and patient instructions. Please see these sections of the encounter for additional information regarding this visit. CMA/ATC served as Neurosurgeonscribe during this visit. History, Physical, and Plan performed by medical provider. Documentation and orders  reviewed and attested to.      Andrena MewsMichael D Mirren Gest, DO    [facility] Physician

## 2018-01-03 ENCOUNTER — Encounter: Payer: Self-pay | Admitting: Sports Medicine

## 2018-01-12 ENCOUNTER — Other Ambulatory Visit: Payer: Self-pay | Admitting: Sports Medicine

## 2018-01-12 DIAGNOSIS — M4722 Other spondylosis with radiculopathy, cervical region: Secondary | ICD-10-CM

## 2018-01-12 MED ORDER — OXYCODONE-ACETAMINOPHEN 5-325 MG PO TABS: 1.0000 | ORAL_TABLET | Freq: Four times a day (QID) | ORAL | 0 refills | Status: DC | PRN

## 2018-01-12 NOTE — Telephone Encounter (Signed)
Pt contacted to inform her of rx refill that has been placed and that Dr. Berline Chough would like to f/u w/ her in 2-3 months.

## 2018-01-12 NOTE — Telephone Encounter (Signed)
See note

## 2018-01-12 NOTE — Telephone Encounter (Signed)
Copied from CRM [id number]. Topic: Quick Communication - Rx Refill/Question >> Jan 12, 2018  9:00 AM Diana Eves B wrote: Medication: oxyCODONE-acetaminophen (PERCOCET/ROXICET) 5-325 MG tablet   Pt has rescheduled her appt. She is going to see Dr. Jordan Likes 01/17/18.   Has the patient contacted their pharmacy? Yes.   (Agent: If no, request that the patient contact the pharmacy for the refill.) Preferred Pharmacy (with phone number or street name): [id number] - [place], [place] - [address] Agent: Please be advised that RX refills may take up to 3 business days. We ask that you follow-up with your pharmacy.

## 2018-01-12 NOTE — Telephone Encounter (Signed)
I am happy to send in a refill for her at this time without an office visit given underlying circumstances in the prior appropriate use.  Prescription has been sent.  If she would like to see Dr. Noreene Filbert for follow-up for other ongoing issues I am happy to defer to him but will otherwise want to see her back in 2 to 3 months.

## 2018-01-16 NOTE — Progress Notes (Addendum)
Amanda Mitchell - 62 y.o. female MRN [id number]  Date of birth: 03/16/1956  SUBJECTIVE:  Including CC & ROS.  Chief Complaint  Patient presents with  . Cyst    Amanda Mitchell is a 62 y.o. female that is presenting with a nodule. She noticed it one month ago. Located on her upper left thigh.  She noticed a bruise and felt the nodule. Denies tenderness. Denies any pain. Doesn't feel the size has changed. No redness or streaking. No recent illness.     Review of Systems  Constitutional: Negative for fever.  HENT: Negative for congestion.   Respiratory: Negative for cough.   Musculoskeletal: Negative for gait problem.  Skin: Negative for color change.    HISTORY: Past Medical, Surgical, Social, and Family History Reviewed & Updated per EMR.   Pertinent Historical Findings include:  Past Medical History:  Diagnosis Date  . Cervical spondylosis with radiculopathy   . Chest tightness   . Chronic migraine     Past Surgical History:  Procedure Laterality Date  . CESAREAN SECTION  1992    No Known Allergies  Family History  Problem Relation Age of Onset  . Congestive Heart Failure Mother 2  . Hypertension Mother   . Cancer Maternal Grandmother        Liver  . Hypertension Maternal Grandmother   . Heart attack Maternal Grandfather 65  . Hypertension Maternal Grandfather   . Cancer Paternal Grandfather      Social History   Socioeconomic History  . Marital status: Married    Spouse name: Not on file  . Number of children: 1  . Years of education: Not on file  . Highest education level: Not on file  Occupational History  . Not on file  Social Needs  . Financial resource strain: Not on file  . Food insecurity:    Worry: Not on file    Inability: Not on file  . Transportation needs:    Medical: Not on file    Non-medical: Not on file  Tobacco Use  . Smoking status: Never Smoker  . Smokeless tobacco: Never Used  Substance and Sexual Activity  . Alcohol use: No    . Drug use: No  . Sexual activity: Not on file  Lifestyle  . Physical activity:    Days per week: Not on file    Minutes per session: Not on file  . Stress: Not on file  Relationships  . Social connections:    Talks on phone: Not on file    Gets together: Not on file    Attends religious service: Not on file    Active member of club or organization: Not on file    Attends meetings of clubs or organizations: Not on file    Relationship status: Not on file  . Intimate partner violence:    Fear of current or ex partner: Not on file    Emotionally abused: Not on file    Physically abused: Not on file    Forced sexual activity: Not on file  Other Topics Concern  . Not on file  Social History Narrative   Lives with husband.       PHYSICAL EXAM:  VS: BP 112/66 (BP Location: Left Arm, Patient Position: Sitting, Cuff Size: Normal)   Pulse 68   Ht  (1.6 m)   Wt 119 lb (54 kg)   SpO2 98%   BMI 21.08 kg/m  Physical Exam Gen: NAD, alert, cooperative with exam, well-appearing  ENT: normal lips, normal nasal mucosa,  Eye: normal EOM, normal conjunctiva and lids CV:  no edema, +2 pedal pulses   Resp: no accessory muscle use, non-labored,  Skin: no rashes, no areas of induration  Neuro: normal tone, normal sensation to touch Psych:  normal insight, alert and oriented MSK:  Left thigh:  Nodule palpated over the left lateral proximal thigh. No tenderness to palpation over this area Nodule palpated over the left anterior mid thigh.  No tenderness palpation of this area Normal strength resistance with hip flexion. Normal knee flexion and extension. Normal gait. Neurovascularly intact  Limited ultrasound: left thigh:  Mobile nodule that is of the same tissue of the surrounding tissue. In the proximal lateral thigh. No vascular uptake  Nodule on anterior mid thigh with no vascular uptake and of same tissue.   Summary: possible for fatty herniation or infarcts   Ultrasound  and interpretation by Clare Gandy, MD    ASSESSMENT & PLAN:   Subcutaneous nodule 2 different areas occurring on the left lateral proximal thigh and the left anterior mid thigh.  Does not have any vascular uptake and appears to be congruent with the same tissue in the area.  Possible for fatty infarcts or lipomas. -Counseled on supportive care -Advised to follow-up in 1 year to rescan

## 2018-01-17 ENCOUNTER — Ambulatory Visit: Payer: No Typology Code available for payment source | Admitting: Sports Medicine

## 2018-01-17 ENCOUNTER — Ambulatory Visit: Payer: No Typology Code available for payment source | Admitting: Family Medicine

## 2018-01-17 ENCOUNTER — Encounter: Payer: Self-pay | Admitting: Family Medicine

## 2018-01-17 ENCOUNTER — Ambulatory Visit: Payer: Self-pay

## 2018-01-17 VITALS — BP 112/66 | HR 68 | Ht 63.0 in | Wt 119.0 lb

## 2018-01-17 DIAGNOSIS — R229 Localized swelling, mass and lump, unspecified: Secondary | ICD-10-CM | POA: Diagnosis not present

## 2018-01-17 DIAGNOSIS — M79605 Pain in left leg: Secondary | ICD-10-CM

## 2018-01-17 NOTE — Patient Instructions (Signed)
Nice to meet you  Please watch the area  Please follow up in one year and we can scan this again

## 2018-01-17 NOTE — Assessment & Plan Note (Addendum)
2 different areas occurring on the left lateral proximal thigh and the left anterior mid thigh.  Does not have any vascular uptake and appears to be congruent with the same tissue in the area.  Possible for fatty infarcts or lipomas. -Counseled on supportive care -Advised to follow-up in 1 year to rescan

## 2018-01-31 ENCOUNTER — Ambulatory Visit: Payer: No Typology Code available for payment source | Admitting: Sports Medicine

## 2018-03-08 ENCOUNTER — Encounter: Payer: Self-pay | Admitting: Sports Medicine

## 2018-03-23 ENCOUNTER — Encounter: Payer: Self-pay | Admitting: Sports Medicine

## 2018-03-23 ENCOUNTER — Ambulatory Visit (INDEPENDENT_AMBULATORY_CARE_PROVIDER_SITE_OTHER): Payer: No Typology Code available for payment source | Admitting: Sports Medicine

## 2018-03-23 VITALS — BP 132/84 | HR 60 | Ht 63.0 in | Wt 122.8 lb

## 2018-03-23 DIAGNOSIS — M4722 Other spondylosis with radiculopathy, cervical region: Secondary | ICD-10-CM | POA: Diagnosis not present

## 2018-03-23 DIAGNOSIS — G894 Chronic pain syndrome: Secondary | ICD-10-CM | POA: Diagnosis not present

## 2018-03-23 DIAGNOSIS — M7711 Lateral epicondylitis, right elbow: Secondary | ICD-10-CM

## 2018-03-23 DIAGNOSIS — Z0000 Encounter for general adult medical examination without abnormal findings: Secondary | ICD-10-CM | POA: Diagnosis not present

## 2018-03-23 LAB — CBC WITH DIFFERENTIAL/PLATELET
BASOS ABS: 0 10*3/uL (ref 0.0–0.1)
Basophils Relative: 0.6 % (ref 0.0–3.0)
EOS ABS: 0.1 10*3/uL (ref 0.0–0.7)
Eosinophils Relative: 1.5 % (ref 0.0–5.0)
HCT: 40.7 % (ref 36.0–46.0)
Hemoglobin: 14 g/dL (ref 12.0–15.0)
LYMPHS ABS: 1.2 10*3/uL (ref 0.7–4.0)
Lymphocytes Relative: 33.1 % (ref 12.0–46.0)
MCHC: 34.3 g/dL (ref 30.0–36.0)
MCV: 95.3 fl (ref 78.0–100.0)
MONOS PCT: 8.2 % (ref 3.0–12.0)
Monocytes Absolute: 0.3 10*3/uL (ref 0.1–1.0)
NEUTROS PCT: 56.6 % (ref 43.0–77.0)
Neutro Abs: 2 10*3/uL (ref 1.4–7.7)
Platelets: 231 10*3/uL (ref 150.0–400.0)
RBC: 4.27 Mil/uL (ref 3.87–5.11)
RDW: 12.3 % (ref 11.5–15.5)
WBC: 3.5 10*3/uL — ABNORMAL LOW (ref 4.0–10.5)

## 2018-03-23 LAB — COMPREHENSIVE METABOLIC PANEL
ALT: 18 U/L (ref 0–35)
AST: 25 U/L (ref 0–37)
Albumin: 4.8 g/dL (ref 3.5–5.2)
Alkaline Phosphatase: 88 U/L (ref 39–117)
BILIRUBIN TOTAL: 0.6 mg/dL (ref 0.2–1.2)
BUN: 19 mg/dL (ref 6–23)
CO2: 29 meq/L (ref 19–32)
Calcium: 9.9 mg/dL (ref 8.4–10.5)
Chloride: 103 mEq/L (ref 96–112)
Creatinine, Ser: 0.83 mg/dL (ref 0.40–1.20)
GFR: 74.32 mL/min (ref >60.00)
GLUCOSE: 99 mg/dL (ref 70–99)
Potassium: 4.5 mEq/L (ref 3.5–5.1)
Sodium: 141 mEq/L (ref 135–145)
Total Protein: 7.3 g/dL (ref 6.0–8.3)

## 2018-03-23 LAB — LIPID PANEL W/REFLEX DIRECT LDL
CHOL/HDL RATIO: 2.6 (calc) (ref <5.0)
Cholesterol: 228 mg/dL — ABNORMAL HIGH (ref <200)
HDL: 88 mg/dL (ref >50)
LDL CHOLESTEROL (CALC): 122 mg/dL — AB
NON-HDL CHOLESTEROL (CALC): 140 mg/dL — AB (ref <130)
TRIGLYCERIDES: 81 mg/dL (ref <150)

## 2018-03-23 LAB — VITAMIN D 25 HYDROXY (VIT D DEFICIENCY, FRACTURES): VITD: 31.6 ng/mL (ref 30.00–100.00)

## 2018-03-23 MED ORDER — OXYCODONE-ACETAMINOPHEN 5-325 MG PO TABS: 1.0000 | ORAL_TABLET | Freq: Four times a day (QID) | ORAL | 0 refills | Status: DC | PRN

## 2018-03-23 MED ORDER — NITROGLYCERIN 0.2 MG/HR TD PT24: MEDICATED_PATCH | TRANSDERMAL | 1 refills | Status: DC

## 2018-03-23 NOTE — Progress Notes (Signed)
PROCEDURE NOTE: THERAPEUTIC EXERCISES (97110) 15 minutes spent for Therapeutic exercises as below and as referenced in the AVS.  This included exercises focusing on stretching, strengthening, with significant focus on eccentric aspects.   Proper technique shown and discussed handout in great detail with ATC.  All questions were discussed and answered.   Long term goals include an improvement in range of motion, strength, endurance as well as avoiding reinjury. Frequency of visits is one time as determined during today's  office visit. Frequency of exercises to be performed is as per handout.  EXERCISES REVIEWED:  Derrill KayGoodman Exercises  tennis elbow stretching

## 2018-03-23 NOTE — Patient Instructions (Addendum)
Please perform the exercise program that we have prepared for you and gone over in detail on a daily basis.  In addition to the handout you were provided you can access your program through: [email or web address]   Your unique program code is: [email or web address]   Nitroglycerin Protocol   Apply 1/4 nitroglycerin patch to affected area daily.  Change position of patch within the affected area every 24 hours.  You may experience a headache during the first 1-2 weeks of using the patch, these should subside.  If you experience headaches after beginning nitroglycerin patch treatment, you may take your preferred over the counter pain reliever.  Another side effect of the nitroglycerin patch is skin irritation or rash related to patch adhesive.  Please notify our office if you develop more severe headaches or rash, and stop the patch.  Tendon healing with nitroglycerin patch may require 12 to 24 weeks depending on the extent of injury.  Men should not use if taking Viagra, Cialis, or Levitra.   Do not use if you have migraines or rosacea.    Also check out [employer]"Foundation Training" which is a program developed by Dr. Myles LippsEric Goodman.   There are links to a couple of his YouTube Videos below and I would like to see performing one of his videos 5-6 days per week.    A good intro video is: "Independence from Pain 7-minute Video" - [email or web address]/[email or web address]   Exercises that focus more on the neck are as below: Dr. Derrill KayGoodman with [employer] teaching neck and shoulder details Part 1 - https://youtu.be/[email or web address] Part 2 Dr. Derrill KayGoodman with [facility] quick routine to practice daily - https://youtu.be/[email or web address]  Do not try to attempt the entire video when first beginning.    Try breaking of each exercise that he goes into shorter segments.  Otherwise if they perform an exercise for 45 seconds, start with 15 seconds and rest and then resume when they begin the new activity.  If  you work your way up to being able to do these videos without having to stop, I expect you will see significant improvements in your pain.  If you enjoy his videos and would like to find out more you can look on his website: [email or web address].  He has a workout streaming option as well as a DVD set available for purchase.  Amazon has the best price for his DVDs.

## 2018-03-23 NOTE — Progress Notes (Signed)
[facility]. [name] [phone]  Amanda Mitchell - 62 y.o. female MRN [id number]  Date of birth: Mar 16, 1956  Visit Date: 03/23/2018  PCP: Richardean Chimera, MD   Referred by: Richardean Chimera, MD  Scribe(s) for today's visit: Stevenson Clinch, CMA  SUBJECTIVE:  Amanda Mitchell is here for Follow-up (neck pain, cyst L 3rd finger)   08/29/2017: Compared to the last office visit, her previously described symptoms show no change, sx are worse at night.  Current symptoms are varying, at times pain will be severe and cause HA and at other times mild. On average the pain is moderate. Pain is worse w/ ROM exercises. At times pain will radiate into the LT shoulder but not into the arm. She does notice occasional stiffness at the base of the skull when HA is present.  She has been taking Percocet as needed, using cervical traction device, and doing home exercises.  Pt c/o knot on left 3rd finger first noticed about 2 months ago. It isn't painful and doesn't interfere with her daily routine.   03/23/2018: Compared to the last office visit, her previously described symptoms are worsening, she c/o increased stiffness and limited ROM. Pain is mostly on the L side but she does have pain on the R side as well. She has been getting HA at night, not more often than normal but when they occur they are severe. She does have occasional pain I the R arm that radiates from the shoulder down but for the most past it is from the elbow down. She has noticed increased stiffness in her shoulders.  Current symptoms are moderate & are radiating to the R arm.  She has been sleeping with only 1 pillow and still wakes with a lot of stiffness. She has been working on home exercises. She also has Percocet that she takes prn at bedtime when pain is severe.   She c/o R elbow pain and weakness in the R arm. She has trouble picking up heavy items. She has noticed some swelling  around the elbow. Pain is mostly on the lateral aspect of the elbow. She denies increased redness but has noticed increased warmth. She has tried taking IBU with short term relief. She has also been doing some exercises to see if that would help. She has also tried icing the elbow with minimal relief. Pain is worse with repetitive movements.   She reports improvement with L 3rd finger pain, she feels that the cyst is gone.    REVIEW OF SYSTEMS: Reports night time disturbances - normally from HA related to neck pain. Reportsnight sweats. Denies unexplained weight loss. Denies personal history of cancer. Denies changes in bowel or bladder habits. Denies recent unreported falls. Denies new or worsening dyspnea or wheezing. Reports headaches.  Reports numbness, tingling or weakness  In the extremities.  Denies dizziness or presyncopal episodes Denies lower extremity edema    HISTORY:  Prior history reviewed and updated per electronic medical record.  Social History   Occupational History  . Not on file  Tobacco Use  . Smoking status: Never Smoker  . Smokeless tobacco: Never Used  Substance and Sexual Activity  . Alcohol use: No  . Drug use: No  . Sexual activity: Not on file   Social History   Social History Narrative   Lives with husband.      Past Medical History:  Diagnosis Date  . Cervical spondylosis with radiculopathy   .  Chest tightness   . Chronic migraine    Past Surgical History:  Procedure Laterality Date  . CESAREAN SECTION  1992   family history includes Cancer in her maternal grandmother and paternal grandfather; Congestive Heart Failure (age of onset: 3272) in her mother; Heart attack (age of onset: 5665) in her maternal grandfather; Hypertension in her maternal grandfather, maternal grandmother, and mother.  DATA OBTAINED & REVIEWED:  No results for input(s): HGBA1C, LABURIC, CREATINE in the last 8760 hours. X-Rays of cervical spine obtained in 2016  available on the PACS system reviewed do show multilevel degenerative changes   OBJECTIVE:  VS:  HT:5\' 3"  (160 cm)   WT:122 lb 12.8 oz (55.7 kg)  BMI:21.76    BP:132/84  HR:60bpm  TEMP: ( )  RESP:99 %   PHYSICAL EXAM: CONSTITUTIONAL: Well-developed, Well-nourished and In no acute distress PSYCHIATRIC: Alert & appropriately interactive. and Not depressed or anxious appearing. RESPIRATORY: No increased work of breathing and Trachea Midline EYES: Pupils are equal., EOM intact without nystagmus. and No scleral icterus.  VASCULAR EXAM: Warm and well perfused NEURO: unremarkable  Heart: S1-S2 heard, no murmur Lungs clear to auscultation bilaterally.   MSK Exam: Neck with generalized stiffness but overall good cervical rotation and sidebending.  She has negative Spurling's compression test and Lhermitte's compression test.  Upper extremity strength is symmetric and 5 out of 5 in all myotomes.  Small amount of pain with palpation over the lateral epicondyle of the right arm.  Pain with resisted wrist extension.       ASSESSMENT   1. Preventative health care   2. Cervical spondylosis with radiculopathy   3. Chronic pain syndrome   4. Lateral epicondylitis of right elbow     PLAN:  Pertinent additional documentation may be included in corresponding procedure notes, imaging studies, problem based documentation and patient instructions.  Procedures:  . Discussed the foundation of treatment for this condition is physical therapy and/or daily (5-6 days/week) therapeutic exercises, focusing on core strengthening, coordination, neuromuscular control/reeducation.  Therapeutic exercises prescribed per procedure note.  Medications:  Meds ordered this encounter  Medications  . oxyCODONE-acetaminophen (PERCOCET/ROXICET) 5-325 MG tablet    Sig: Take 1 tablet by mouth every 6 (six) hours as needed for severe pain.    Dispense:  60 tablet    Refill:  0  . nitroGLYCERIN (NITRODUR - DOSED IN  MG/24 HR) 0.2 mg/hr patch    Sig: Place 1/4 to 1/2 of a patch over affected region. Remove and replace once daily.  Slightly alter skin placement daily    Dispense:  30 patch    Refill:  1    For musculoskeletal purposes.  Okay to cut patch.   Discussion/Instructions: No problem-specific Assessment & Plan notes found for this encounter.  . Wellness labs obtained today.  Continue with lifestyle modifications.  . Chronic pain medications refilled today.  Used only sparingly and intermittently.  [place] controlled substance [place] reviewed and appropriate filling patterns.   . Links to [employer] videos provided today per Patient Instructions.  These exercises were developed by Myles LippsEric Goodman, DC with a strong emphasis on core neuromuscular reducation and postural realignment through body-weight exercises. . Discussed options with the patient today including biologic treatment with topical nitroglycerin. Patient has no contraindications & understands the risks, benefits and intentions of treatment. Emphasized the importance of rotating sites as well as appropriate and expected adverse reactions including orthostasis, headache, adhesive sensitivity. Begin with 1/4 patch to the affected area. Okay  to titrate to half a patch as tolerated. . Discussed red flag symptoms that warrant earlier emergent evaluation and patient voices understanding. . Activity modifications and the importance of avoiding exacerbating activities (limiting pain to no more than a 4 / 10 during or following activity) recommended and discussed.  Follow-up:  . Return in about 3 months (around 06/23/2018).  . If any lack of improvement consider: . further diagnostic evaluation with MRI of the cervical spine for consideration of epidural steroid injections but she reports her symptoms are well managed at this time.     CMA/ATC served as Neurosurgeon during this visit. History, Physical, and Plan performed by medical  provider. Documentation and orders reviewed and attested to.      [name], DO    [facility]

## 2018-03-26 NOTE — Progress Notes (Signed)
ASCVD risk of 2.8% for 10 years.  Recommend dietary and lifestyle modification.  My chart message: Your labs overall acceptable.  Your cholesterol is slightly higher than what I would like to see however does not warrant medication intervention at this time.  Your risk of having a major atherosclerotic event is less than 2.8%.  The threshold for treatment is 7.5%.  This takes to account blood pressure, age, gender, race and other medical conditions.  Your HDL is remarkably high which is protective.

## 2018-03-29 ENCOUNTER — Encounter: Payer: Self-pay | Admitting: Sports Medicine

## 2018-05-28 ENCOUNTER — Encounter: Payer: Self-pay | Admitting: Sports Medicine

## 2018-05-28 DIAGNOSIS — M7711 Lateral epicondylitis, right elbow: Secondary | ICD-10-CM | POA: Insufficient documentation

## 2018-06-04 ENCOUNTER — Encounter: Payer: Self-pay | Admitting: Sports Medicine

## 2018-06-04 ENCOUNTER — Ambulatory Visit (INDEPENDENT_AMBULATORY_CARE_PROVIDER_SITE_OTHER): Payer: No Typology Code available for payment source | Admitting: Sports Medicine

## 2018-06-04 VITALS — BP 130/86 | HR 61 | Ht 63.0 in | Wt 121.8 lb

## 2018-06-04 DIAGNOSIS — M7711 Lateral epicondylitis, right elbow: Secondary | ICD-10-CM

## 2018-06-04 DIAGNOSIS — M9902 Segmental and somatic dysfunction of thoracic region: Secondary | ICD-10-CM | POA: Diagnosis not present

## 2018-06-04 DIAGNOSIS — M9901 Segmental and somatic dysfunction of cervical region: Secondary | ICD-10-CM | POA: Diagnosis not present

## 2018-06-04 DIAGNOSIS — G894 Chronic pain syndrome: Secondary | ICD-10-CM

## 2018-06-04 DIAGNOSIS — M9908 Segmental and somatic dysfunction of rib cage: Secondary | ICD-10-CM

## 2018-06-04 DIAGNOSIS — M4722 Other spondylosis with radiculopathy, cervical region: Secondary | ICD-10-CM

## 2018-06-04 MED ORDER — OXYCODONE-ACETAMINOPHEN 5-325 MG PO TABS: 1.0000 | ORAL_TABLET | Freq: Four times a day (QID) | ORAL | 0 refills | Status: DC | PRN

## 2018-06-04 NOTE — Progress Notes (Signed)
PROCEDURE NOTE: THERAPEUTIC EXERCISES (97110) 15 minutes spent for Therapeutic exercises as below and as referenced in the AVS.  This included exercises focusing on stretching, strengthening, with significant focus on eccentric aspects.   Proper technique shown and discussed handout in great detail with ATC.  All questions were discussed and answered.   Long term goals include an improvement in range of motion, strength, endurance as well as avoiding reinjury. Frequency of visits is one time as determined during today's  office visit. Frequency of exercises to be performed is as per handout.  EXERCISES REVIEWED: Cervical Towel Stretching Exercises 

## 2018-06-04 NOTE — Progress Notes (Signed)
Veverly FellsMichael D. Delorise Shinerigby, DO  [facility] [place] [phone]  Amanda Mitchell - 62 y.o. female MRN [id number]  Date of birth: [birth year]  Visit Date: 06/04/2018  PCP: Richardean ChimeraMcComb, John, MD   Referred by: Richardean ChimeraMcComb, John, MD  Scribe(s) for today's visit: Stevenson ClinchBrandy Coleman, CMA  SUBJECTIVE:  Amanda FairyHelen Cowens is here for No chief complaint on file.   HPI  08/29/2017: Compared to the last office visit, her previously described symptoms show no change, sx are worse at night.  Current symptoms are varying, at times pain will be severe and cause HA and at other times mild. On average the pain is moderate. Pain is worse w/ ROM exercises. At times pain will radiate into the LT shoulder but not into the arm. She does notice occasional stiffness at the base of the skull when HA is present.  She has been taking Percocet as needed, using cervical traction device, and doing home exercises.  Pt c/o knot on left 3rd finger first noticed about 2 months ago. It isn't painful and doesn't interfere with her daily routine.   03/23/2018: Compared to the last office visit, her previously described symptoms are worsening, she c/o increased stiffness and limited ROM. Pain is mostly on the L side but she does have pain on the R side as well. She has been getting HA at night, not more often than normal but when they occur they are severe. She does have occasional pain I the R arm that radiates from the shoulder down but for the most past it is from the elbow down. She has noticed increased stiffness in her shoulders.  Current symptoms are moderate & are radiating to the R arm.  She has been sleeping with only 1 pillow and still wakes with a lot of stiffness. She has been working on home exercises. She also has Percocet that she takes prn at bedtime when pain is severe.  She c/o R elbow pain and weakness in the R arm. She has trouble picking up heavy items. She has noticed some swelling around  the elbow. Pain is mostly on the lateral aspect of the elbow. She denies increased redness but has noticed increased warmth. She has tried taking IBU with short term relief. She has also been doing some exercises to see if that would help. She has also tried icing the elbow with minimal relief. Pain is worse with repetitive movements.  She reports improvement with L 3rd finger pain, she feels that the cyst is gone.   06/04/2018 Neck Pain Compared to the last office visit, her previously described symptoms are worsening. She reports increased stiffness on the L side of her neck. She reports tension HA at night d/t neck pain.  Current symptoms are moderate & are radiating to the L shoulder. She notes occasional n/t in her fingers when sleeping on her back. She will also have n/t in in the 4th anf 5th fingers while riding bike.  She has been taking Excedrin migraine and percocet prn with some relief. Excedrin helps with HA but makes it a little difficult to sleep.   R elbow pain: Compared to the last office visit, her previously described symptoms are improving. Elbow pain has resolved.  She has been doing HEP with good relief. She never started [employer].   REVIEW OF SYSTEMS: Reports night time disturbances - normally from HA related to neck pain. Reports night sweats. Denies unexplained weight loss. Denies personal history of cancer. Denies  changes in bowel or bladder habits. Denies recent unreported falls. Denies new or worsening dyspnea or wheezing. Reports headaches.  Reports numbness, tingling or weakness in the extremities - this has improved.  Denies dizziness or presyncopal episodes Denies lower extremity edema    HISTORY:  Prior history reviewed and updated per electronic medical record.  Social History   Occupational History  . Not on file  Tobacco Use  . Smoking status: Never Smoker  . Smokeless tobacco: Never Used  Substance and Sexual Activity  . Alcohol use: No   . Drug use: No  . Sexual activity: Not on file   Social History   Social History Narrative   Lives with husband.      Past Medical History:  Diagnosis Date  . Cervical spondylosis with radiculopathy   . Chest tightness   . Chronic migraine    Past Surgical History:  Procedure Laterality Date  . CESAREAN SECTION  1992   family history includes Cancer in her maternal grandmother and paternal grandfather; Congestive Heart Failure (age of onset: 28) in her mother; Heart attack (age of onset: 97) in her maternal grandfather; Hypertension in her maternal grandfather, maternal grandmother, and mother.  DATA OBTAINED & REVIEWED:  No results for input(s): HGBA1C, LABURIC, CREATINE in the last 8760 hours. X-Rays of cervical spine obtained in 2016 available on the PACS system reviewed do show multilevel degenerative changes  OBJECTIVE:  VS:  HT:5\' 3"  (160 cm)   WT:121 lb 12.8 oz (55.2 kg)  BMI:21.58    BP:130/86  HR:61bpm  TEMP: ( )  RESP:99 %   PHYSICAL EXAM: CONSTITUTIONAL: Well-developed, Well-nourished and In no acute distress PSYCHIATRIC: Alert & appropriately interactive. and Not depressed or anxious appearing. RESPIRATORY: No increased work of breathing and Trachea Midline EYES: Pupils are equal., EOM intact without nystagmus. and No scleral icterus.  VASCULAR EXAM: Warm and well perfused NEURO: unremarkable  MSK Exam: Neck with generalized stiffness but overall symmetric but limited cervical rotation and sidebending.  She has negative Spurling's compression test and Lhermitte's compression test.  Upper extremity strength is symmetric and 5 out of 5 in all myotomes.            ASSESSMENT      ICD-10-CM   1. Chronic pain syndrome G89.4   2. Cervical spondylosis with radiculopathy M47.22 oxyCODONE-acetaminophen (PERCOCET/ROXICET) 5-325 MG tablet  3. Somatic dysfunction of cervical region M99.01   4. Somatic dysfunction of thoracic region M99.02   5. Somatic  dysfunction of rib cage region M99.08   6. Lateral epicondylitis of right elbow M77.11    Significantly improved, no focal symptoms today.    PLAN:  Pertinent additional documentation may be included in corresponding procedure notes, imaging studies, problem based documentation and patient instructions.  Procedures:  . Osteopathic manipulation was performed today based on physical exam findings.  Please see procedure note for further information including Osteopathic Exam findings . Discussed the foundation of treatment for this condition is physical therapy and/or daily (5-6 days/week) therapeutic exercises, focusing on core strengthening, coordination, neuromuscular control/reeducation.  Therapeutic exercises prescribed per procedure note.  Medications:  Meds ordered this encounter  Medications  . oxyCODONE-acetaminophen (PERCOCET/ROXICET) 5-325 MG tablet    Sig: Take 1 tablet by mouth every 6 (six) hours as needed for severe pain.    Dispense:  60 tablet    Refill:  0   Discussion/Instructions: No problem-specific Assessment & Plan notes found for this encounter.  . Cervical pain with some ulnar neuritis  likely from writing.  See recommendations on AVS for changing her bike for position given this time she is most symptomatic.  Marland Kitchen Chronic pain medications refilled today.  Used only sparingly and intermittently.    . Continue previously prescribed home exercise program.  . Discussed red flag symptoms that warrant earlier emergent evaluation and patient voices understanding. . Activity modifications and the importance of avoiding exacerbating activities (limiting pain to no more than a 4 / 10 during or following activity) recommended and discussed.  Follow-up:  . Return in about 6 weeks (around 07/16/2018) for consideration of repeat Osteopathic Manipulation.  Marland Kitchen lbsmfollowup: further diagnostic evaluation with Repeat x-rays and/or cervical MRI if any recurrent radicular symptoms. and  referral to Physical Therapy     CMA/ATC served as scribe during this visit. History, Physical, and Plan performed by medical provider. Documentation and orders reviewed and attested to.      [name], DO    [facility]

## 2018-06-04 NOTE — Patient Instructions (Addendum)
Look for shorter stem for the bike as well as increasing the angle so that it brings the handlebars closer to you end up  Please perform the exercise program that we have prepared for you and gone over in detail on a daily basis.  In addition to the handout you were provided you can access your program through: [email or web address]   Your unique program code is: [email or web address]

## 2018-06-04 NOTE — Progress Notes (Signed)
PROCEDURE NOTE : OSTEOPATHIC MANIPULATION The decision today to treat with Osteopathic Manipulative Therapy (OMT) was based on physical exam findings. Verbal consent was obtained following a discussion with the patient regarding the of risks, benefits and potential side effects, including an acute pain flare,post manipulation soreness and need for repeat treatments. Additionally, we specifically discussed the minimal risk of  injury to neurovascular structures associated with Cervical manipulation.   Contraindications to OMT: NONE  Manipulation was performed as below: Regions Treated OMT Techniques Used  Cervical spine Thoracic spine Ribs HVLA muscle energy myofascial release   The patient tolerated the treatment well and reported Improved symptoms following treatment today. Patient was given medications, exercises, stretches and lifestyle modifications per AVS and verbally.   OSTEOPATHIC/STRUCTURAL EXAM:   OA - rotated right C4 ERS left (Extended, Rotated & Sidebent) T2 ERS right (Extended, Rotated & Sidebent) T6 -8 Neutral, Rotated LEFT, Sidebent RIGHT Rib 4 Left  Posterior

## 2018-06-29 ENCOUNTER — Ambulatory Visit: Payer: No Typology Code available for payment source | Admitting: Sports Medicine

## 2018-07-27 ENCOUNTER — Encounter: Payer: Self-pay | Admitting: Sports Medicine

## 2018-07-27 ENCOUNTER — Ambulatory Visit (INDEPENDENT_AMBULATORY_CARE_PROVIDER_SITE_OTHER): Payer: No Typology Code available for payment source | Admitting: Sports Medicine

## 2018-07-27 ENCOUNTER — Telehealth: Payer: Self-pay | Admitting: Sports Medicine

## 2018-07-27 VITALS — BP 120/78 | HR 61 | Ht 63.0 in | Wt 123.8 lb

## 2018-07-27 DIAGNOSIS — Z23 Encounter for immunization: Secondary | ICD-10-CM

## 2018-07-27 DIAGNOSIS — M9907 Segmental and somatic dysfunction of upper extremity: Secondary | ICD-10-CM | POA: Diagnosis not present

## 2018-07-27 DIAGNOSIS — M9901 Segmental and somatic dysfunction of cervical region: Secondary | ICD-10-CM

## 2018-07-27 DIAGNOSIS — M9908 Segmental and somatic dysfunction of rib cage: Secondary | ICD-10-CM

## 2018-07-27 DIAGNOSIS — G894 Chronic pain syndrome: Secondary | ICD-10-CM

## 2018-07-27 DIAGNOSIS — M9902 Segmental and somatic dysfunction of thoracic region: Secondary | ICD-10-CM

## 2018-07-27 DIAGNOSIS — M4722 Other spondylosis with radiculopathy, cervical region: Secondary | ICD-10-CM

## 2018-07-27 NOTE — Telephone Encounter (Addendum)
Per Dr. Berline Choughigby, will refill at f/u appt.

## 2018-07-27 NOTE — Progress Notes (Signed)
Amanda Mitchell. Amanda [facility] [phone]  Amanda Mitchell - 62 y.o. female MRN [id number]  Date of birth: Jun 22, 1956  Visit Date: 07/27/2018  PCP: Richardean Chimera, MD   Referred by: Richardean Chimera, MD  Scribe(s) for today's visit: Stevenson Clinch, CMA   SUBJECTIVE:  Amanda Mitchell is here for Follow-up (chronic pain syndrome, neck pain)  HPI:  08/29/2017: Compared to the last office visit, her previously described symptoms show no change, sx are worse at night.  Current symptoms are varying, at times pain will be severe and cause HA and at other times mild. On average the pain is moderate. Pain is worse w/ ROM exercises. At times pain will radiate into the LT shoulder but not into the arm. She does notice occasional stiffness at the base of the skull when HA is present.  She has been taking Percocet as needed, using cervical traction device, and doing home exercises.  Pt c/o knot on left 3rd finger first noticed about 2 months ago. It isn't painful and doesn't interfere with her daily routine.   03/23/2018: Compared to the last office visit, her previously described symptoms are worsening, she c/o increased stiffness and limited ROM. Pain is mostly on the L side but she does have pain on the R side as well. She has been getting HA at night, not more often than normal but when they occur they are severe. She does have occasional pain I the R arm that radiates from the shoulder down but for the most past it is from the elbow down. She has noticed increased stiffness in her shoulders.  Current symptoms are moderate & are radiating to the R arm.  She has been sleeping with only 1 pillow and still wakes with a lot of stiffness. She has been working on home exercises. She also has Percocet that she takes prn at bedtime when pain is severe.  She c/o R elbow pain and weakness in the R arm. She has trouble picking up heavy items. She has noticed some  swelling around the elbow. Pain is mostly on the lateral aspect of the elbow. She denies increased redness but has noticed increased warmth. She has tried taking IBU with short term relief. She has also been doing some exercises to see if that would help. She has also tried icing the elbow with minimal relief. Pain is worse with repetitive movements.  She reports improvement with L 3rd finger pain, she feels that the cyst is gone.   06/04/2018 Neck Pain Compared to the last office visit, her previously described symptoms are worsening. She reports increased stiffness on the L side of her neck. She reports tension HA at night d/t neck pain.  Current symptoms are moderate & are radiating to the L shoulder. She notes occasional n/t in her fingers when sleeping on her back. She will also have n/t in in the 4th anf 5th fingers while riding bike.  She has been taking Excedrin migraine and percocet prn with some relief. Excedrin helps with HA but makes it a little difficult to sleep.   R elbow pain: Compared to the last office visit, her previously described symptoms are improving. Elbow pain has resolved.  She has been doing HEP with good relief. She never started [employer].  07/27/2018: Compared to the last office visit on 06/04/18, her previously described neck symptoms are improving w/ noted improved ROM.  She states that she has been doing  her towel stretches.  She con't to get tension HA about 2-3x/week.  She states that she wakes up in the middle of the night w/ these headaches and will take some OTC medicine which relieves the HA and she goes back to sleep. Current symptoms are moderate & are radiating to the L shoulder. She has been taking Excedrin migraine prn w/ some relief.  She con't to do her HEP consisting of towel stretches which are helping.  REVIEW OF SYSTEMS: Reports night time disturbances - normally from HA related to neck pain. Reports night sweats. Denies unexplained weight  loss. Denies personal history of cancer. Denies changes in bowel or bladder habits. Denies recent unreported falls. Denies new or worsening dyspnea or wheezing. Reports headaches.  Reports numbness, tingling or weakness in the extremities - this has improved.  Denies dizziness or presyncopal episodes Denies lower extremity edema    HISTORY:  Prior history reviewed and updated per electronic medical record.  Social History   Occupational History  . Not on file  Tobacco Use  . Smoking status: Never Smoker  . Smokeless tobacco: Never Used  Substance and Sexual Activity  . Alcohol use: No  . Drug use: No  . Sexual activity: Not on file   Social History   Social History Narrative   Lives with husband.      Past Medical History:  Diagnosis Date  . Cervical spondylosis with radiculopathy   . Chest tightness   . Chronic migraine    Past Surgical History:  Procedure Laterality Date  . CESAREAN SECTION  1992   family history includes Cancer in her maternal grandmother and paternal grandfather; Congestive Heart Failure (age of onset: 7) in her mother; Heart attack (age of onset: 59) in her maternal grandfather; Hypertension in her maternal grandfather, maternal grandmother, and mother.  DATA OBTAINED & REVIEWED:  No results for input(s): HGBA1C, LABURIC, CREATINE in the last 8760 hours. X-Rays of cervical spine obtained in 2016 available on the PACS system reviewed do show multilevel degenerative changes  OBJECTIVE:  VS:  HT:5\' 3"  (160 cm)   WT:123 lb 12.8 oz (56.2 kg)  BMI:21.94    BP:120/78  HR:61bpm  TEMP: ( )  RESP:99 %   PHYSICAL EXAM: CONSTITUTIONAL: Well-developed, Well-nourished and In no acute distress PSYCHIATRIC: Alert & appropriately interactive. and Not depressed or anxious appearing. RESPIRATORY: No increased work of breathing and Trachea Midline EYES: Pupils are equal., EOM intact without nystagmus. and No scleral icterus.  VASCULAR EXAM: Warm and  well perfused NEURO: unremarkable  MSK Exam: Neck with generalized stiffness but overall symmetric but limited cervical rotation and sidebending.  She has negative Spurling's compression test and Lhermitte's compression test.  Upper extremity strength is symmetric and 5 out of 5 in all myotomes.            ASSESSMENT      ICD-10-CM   1. Need for influenza vaccination Z23 Flu Vaccine QUAD 6+ mos PF IM (Fluarix Quad PF)  2. Somatic dysfunction of upper extremity M99.07   3. Somatic dysfunction of cervical region M99.01   4. Somatic dysfunction of thoracic region M99.02   5. Somatic dysfunction of rib cage region M99.08   6. Cervical spondylosis with radiculopathy M47.22   7. Chronic pain syndrome G89.4     PLAN:  Pertinent additional documentation may be included in corresponding procedure notes, imaging studies, problem based documentation and patient instructions.  Procedures:  . Osteopathic manipulation was performed today based on physical exam  findings.  Please see procedure note for further information including Osteopathic Exam findings . Discussed the foundation of treatment for this condition is physical therapy and/or daily (5-6 days/week) therapeutic exercises, focusing on core strengthening, coordination, neuromuscular control/reeducation.  Therapeutic exercises prescribed per procedure note.  Medications:  No orders of the defined types were placed in this encounter.  Discussion/Instructions: Chronic pain syndrome Patient continues to do well on low-dose of intermittent Percocet No. 60 every 3 months.  This is recently refill at her last office visit.  She will call for further refills plan this time.  [place] controlled [place].  Cervical spondylosis with radiculopathy This is doing quite well.  She has improved neck symptoms following manipulation and home therapeutic exercises. Repeat osteopathic manipulation today.  . Cervical pain with  some ulnar neuritis likely from riding bikes has moderately improve.  . Chronic pain medications refilled today.  Used only sparingly and intermittently.    . Continue previously prescribed home exercise program.  . Discussed red flag symptoms that warrant earlier emergent evaluation and patient voices understanding. . Activity modifications and the importance of avoiding exacerbating activities (limiting pain to no more than a 4 / 10 during or following activity) recommended and discussed.  Follow-up:  . Return in about 8 weeks (around 09/21/2018) for consideration of repeat osteopathic manipulation.  Marland Kitchen. lbsmfollowup: consider further diagnostic evaluation with Repeat x-rays and/or cervical MRI if any recurrent radicular symptoms. and consider referral to Physical Therapy     CMA/ATC served as scribe during this visit. History, Physical, and Plan performed by medical provider. Documentation and orders reviewed and attested to.      Andrena MewsMichael D Rigby, DO    [facility]

## 2018-07-27 NOTE — Progress Notes (Signed)
PROCEDURE NOTE : OSTEOPATHIC MANIPULATION The decision today to treat with Osteopathic Manipulative Therapy (OMT) was based on physical exam findings. Verbal consent was obtained following a discussion with the patient regarding the of risks, benefits and potential side effects, including an acute pain flare,post manipulation soreness and need for repeat treatments.     Contraindications to OMT: NONE  Manipulation was performed as below: Regions Treated OMT Techniques Used  Cervical spine Thoracic spine Ribs Upper extremities HVLA muscle energy myofascial release   The patient tolerated the treatment well and reported Improved symptoms following treatment today. Patient was given medications, exercises, stretches and lifestyle modifications per AVS and verbally.   OSTEOPATHIC/STRUCTURAL EXAM:   OA - rotated right C5 FRS right (Flexed, Rotated & Sidebent) T2 -6 Neutral, Rotated LEFT, Sidebent RIGHT T8 FRS right (Flexed, Rotated & Sidebent) Rib 4 Right  Posterior Right pec minor tenderpoint Right supraspinatous tenderpoint Right posterior radial head

## 2018-07-27 NOTE — Telephone Encounter (Signed)
Called [phone] (on DPR) and left VM advising that rx will be refilled at next f/u OV.

## 2018-07-27 NOTE — Telephone Encounter (Signed)
Copied from CRM [phone]#[id number]. Topic: Quick Communication - Rx Refill/Question >> Jul 27, 2018 11:05 AM Burchel, Abbi R wrote: Medication: oxyCODONE-acetaminophen (PERCOCET/ROXICET) 5-325 MG tablet   Pt was unsure if Dr Berline Choughigby was planning to send this rx in to the pharmacy today or wait until her next visit to do so.  Please call pt to clarify.   [phone]  [facility] Pharm[facility]  [id number] [place]- [place], [place] - [address] AT [facility] [address] [facility] [address] [place] [id number] Phone: [phone] Fax: [phone]

## 2018-07-28 ENCOUNTER — Encounter: Payer: Self-pay | Admitting: Sports Medicine

## 2018-07-28 NOTE — Assessment & Plan Note (Signed)
This is doing quite well.  She has improved neck symptoms following manipulation and home therapeutic exercises. Repeat osteopathic manipulation today.

## 2018-07-28 NOTE — Assessment & Plan Note (Signed)
Patient continues to do well on low-dose of intermittent Percocet No. 60 every 3 months.  This is recently refill at her last office visit.  She will call for further refills plan this time.  [place] controlled substance [place] reviewed.

## 2018-08-09 IMAGING — CT CT HEART SCORING
2 series · 16 of 20 positions shown, 18 images · non-contrast
Comparison: None.

CLINICAL DATA: Risk stratification

EXAM:
Coronary Calcium Score
TECHNIQUE: The patient was scanned on a Siemens Somatom 64 slice scanner. Axial
non-contrast 3 mm slices were carried out through the heart. The
data set was analyzed on a dedicated work station and scored using
the Agatson method.

[Series 2: casc 3.0 i36f 2 bestdiast 66 % · axial · 0.29mm/px · z∈[-225,-129]mm · 8 of 42 slices shown, 10 images]
[im 5/42  vessel]
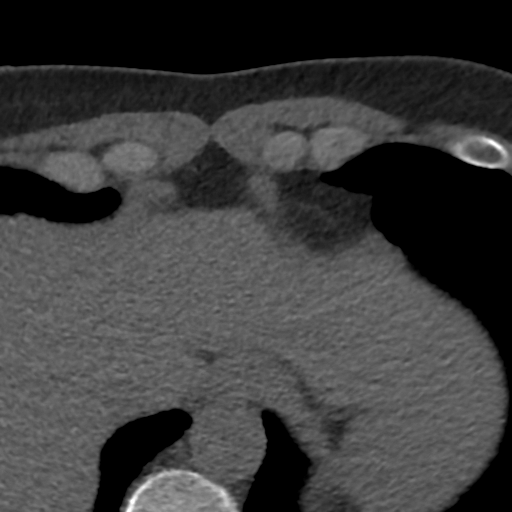
[im 5/42  lung]
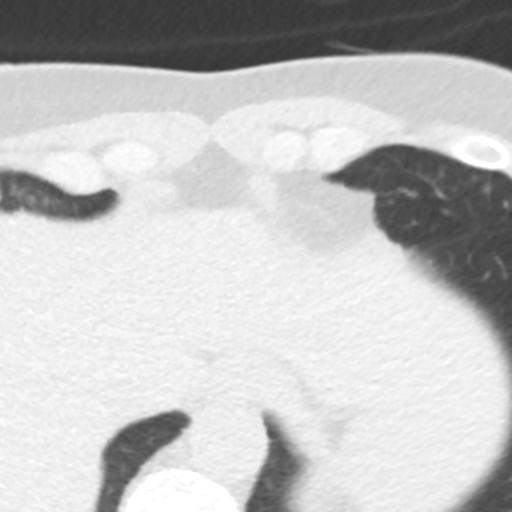
[im 10/42  vessel]
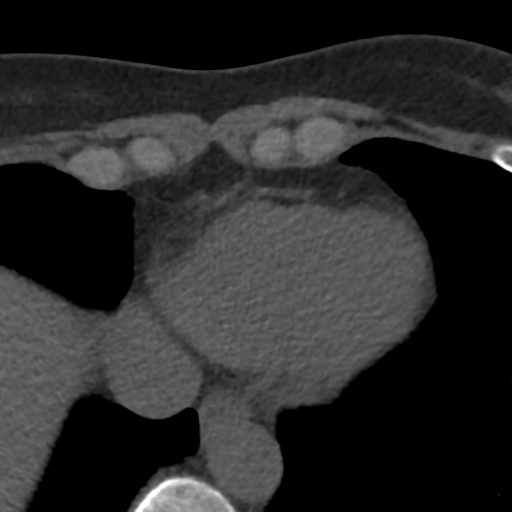
[im 14/42  vessel]
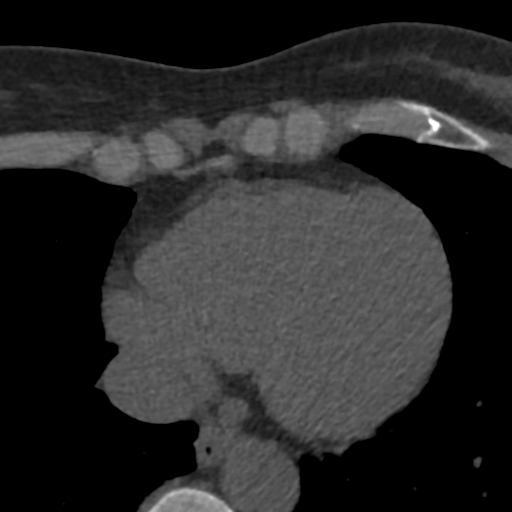
[im 19/42  vessel]
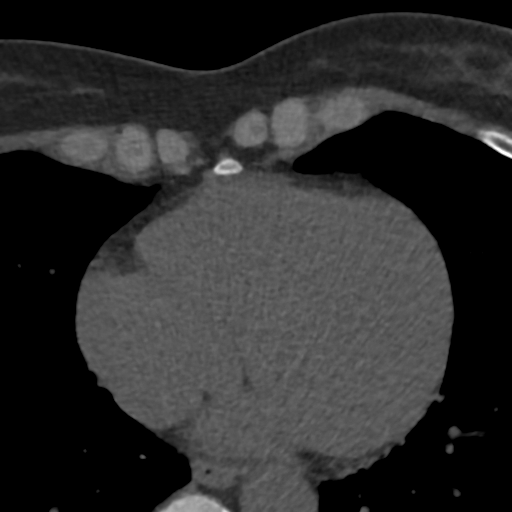
[im 23/42  vessel]
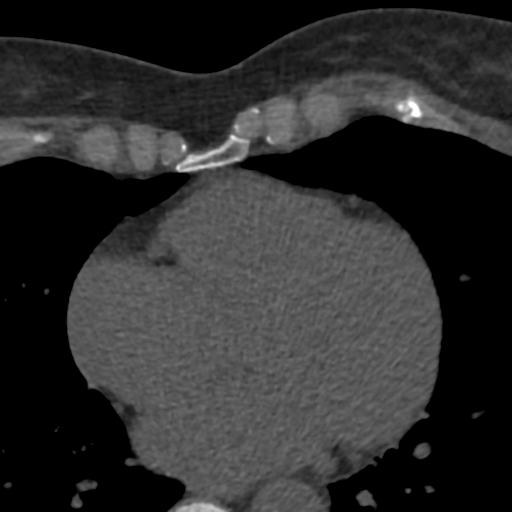
[im 23/42  lung]
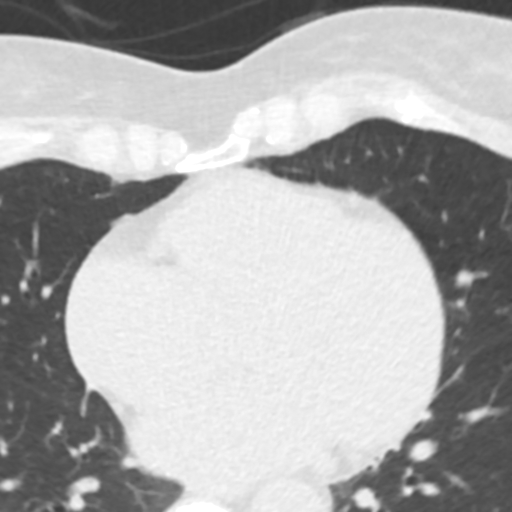
[im 28/42  vessel]
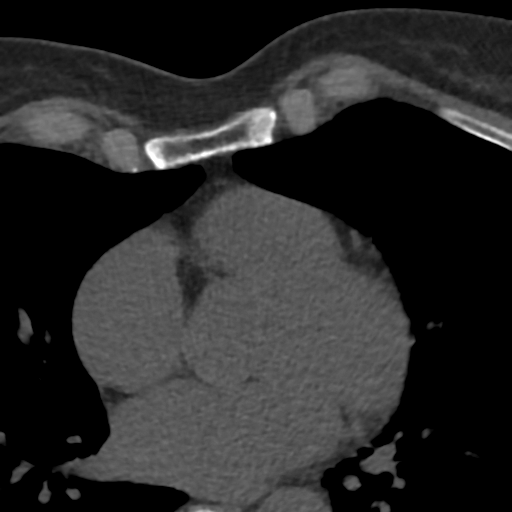
[im 32/42  vessel]
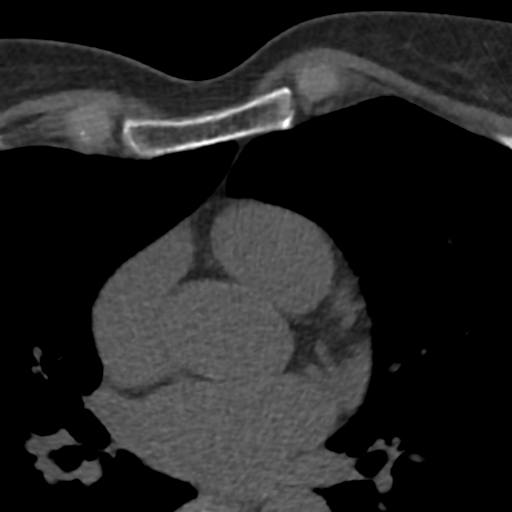
[im 37/42  vessel]
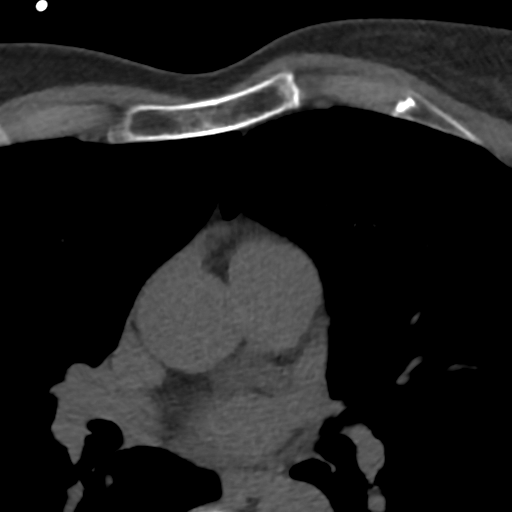

[Series 4: lung st 66 % · axial · 0.65mm/px · z∈[-225,-129]mm · 8 of 42 slices shown]
[im 5/42  lung]
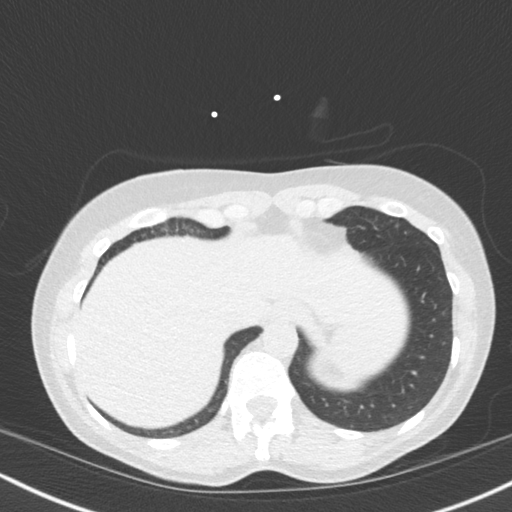
[im 10/42  lung]
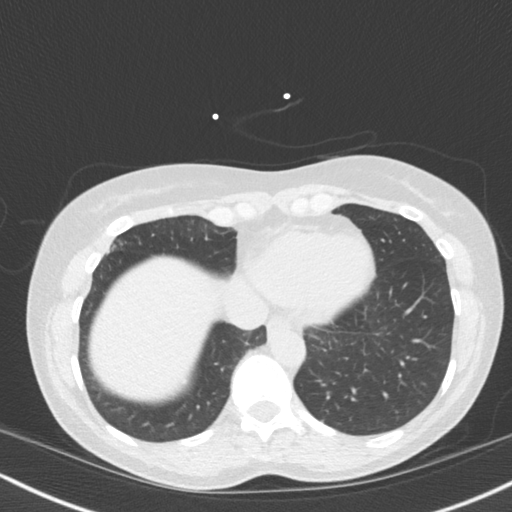
[im 14/42  lung]
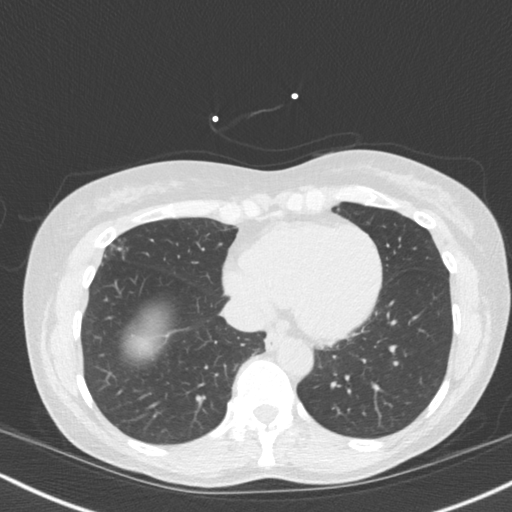
[im 19/42  lung]
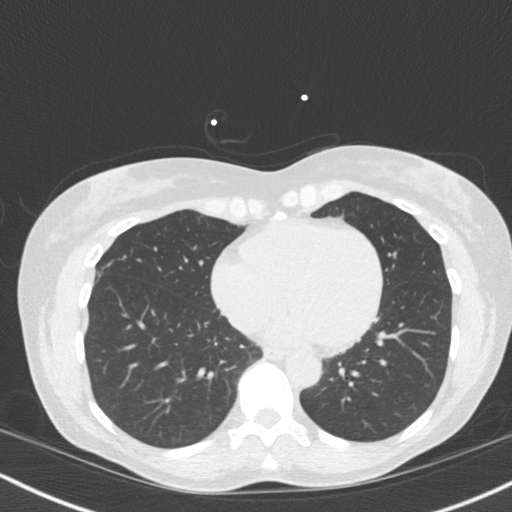
[im 23/42  lung]
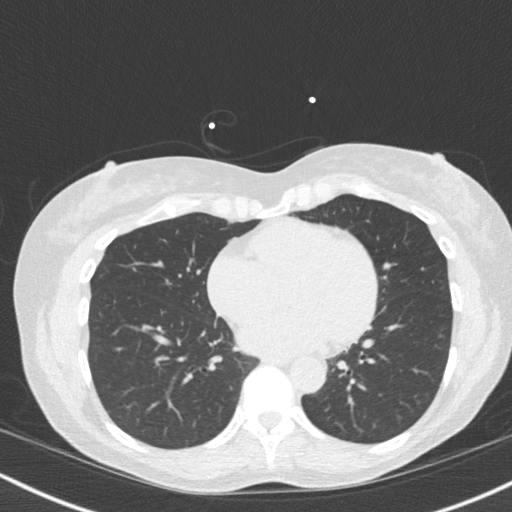
[im 28/42  lung]
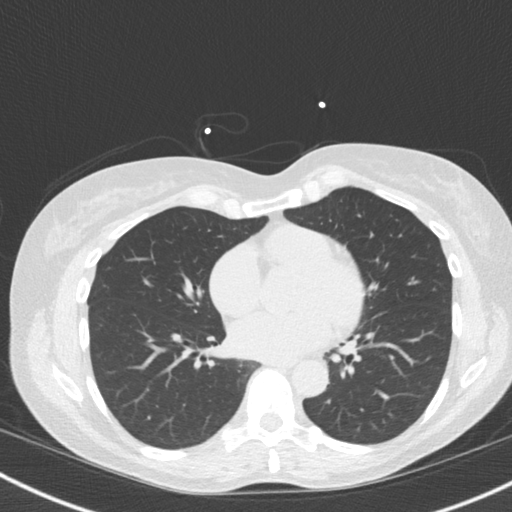
[im 32/42  lung]
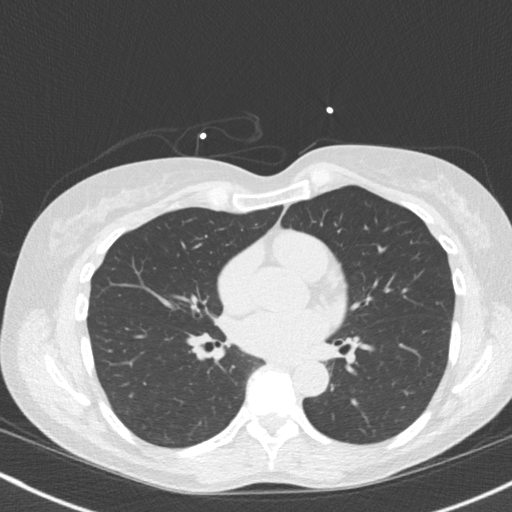
[im 37/42  lung]
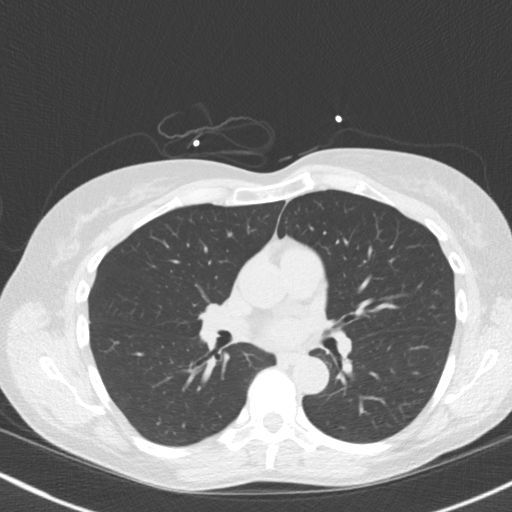

[16 of 20 positions shown; findings below may reference images not displayed]

FINDINGS: Non-cardiac: See separate report from [REDACTED].

Ascending Aorta:  Normal size.  No calcifications.

Pericardium: Normal.

Coronary arteries:  Normal origin.
IMPRESSION: Coronary calcium score of 0. This was 0 percentile for age and sex
matched control.

Kam Shui Ganglani

EXAM:
OVER-READ INTERPRETATION  CT CHEST

The following report is an over-read performed by radiologist Dr.
over-read does not include interpretation of cardiac or coronary
anatomy or pathology. The calcium score interpretation by the
cardiologist is attached.
FINDINGS: Limited view of the lung parenchyma demonstrates no suspicious
nodularity. Branching nodular pattern in the peripheral RIGHT middle
lobe (image 30, series 3). Airways are normal.

Limited view of the mediastinum demonstrates no adenopathy.
Esophagus normal.

Limited view of the upper abdomen unremarkable.

Limited view of the skeleton and chest wall is unremarkable.
IMPRESSION: 1. Small focus of infection or inflammation in the lateral aspect of
the RIGHT middle lobe.
2. Otherwise, no significant extracardiac findings.

## 2018-09-28 ENCOUNTER — Encounter: Payer: Self-pay | Admitting: Sports Medicine

## 2018-09-28 ENCOUNTER — Ambulatory Visit (INDEPENDENT_AMBULATORY_CARE_PROVIDER_SITE_OTHER): Payer: No Typology Code available for payment source | Admitting: Sports Medicine

## 2018-09-28 VITALS — BP 120/80 | HR 62 | Ht 63.0 in | Wt 125.0 lb

## 2018-09-28 DIAGNOSIS — G894 Chronic pain syndrome: Secondary | ICD-10-CM

## 2018-09-28 DIAGNOSIS — M4722 Other spondylosis with radiculopathy, cervical region: Secondary | ICD-10-CM

## 2018-09-28 DIAGNOSIS — M25551 Pain in right hip: Secondary | ICD-10-CM

## 2018-09-28 MED ORDER — OXYCODONE-ACETAMINOPHEN 5-325 MG PO TABS: 1.0000 | ORAL_TABLET | Freq: Four times a day (QID) | ORAL | 0 refills | Status: AC | PRN

## 2018-09-28 NOTE — Progress Notes (Signed)
Amanda FellsMichael D. Delorise Shinerigby, DO  [facility], [facility] Amanda Mitchell [phone]  Amanda FairyHelen Mitchell - 63 y.o. female MRN [id number]  Date of birth: 24-Jan-1956  Visit Date: September 28, 2018  PCP: Richardean ChimeraMcComb, John, MD   Referred by: Richardean ChimeraMcComb, John, MD  SUBJECTIVE:  Chief Complaint  Patient presents with  . Follow-up    neck pain radiating to L shoulder    HPI: Patient is here for follow-up of chronic pain with persistent neck stiffness and discomfort.  She has not had any significant changes and continues to use intermittent and exacerbated.  She has used 60 pills in the last 3 months.  She has reportedly overall been doing well and continues to be run some frequent basis of miles.  She cycles several times per month.  She does have some associated right hip pain especially with running.  No specific formal therapeutic exercises for the hip but is using towel stretching for her neck and this does seem to be beneficial.  She is not interested in repeat manipulation today she denies any radicular symptoms at this time.  REVIEW OF SYSTEMS: Some nighttime disturbances due to the neck pain but this is minimal.  She does get chronic headaches that are unchanged.  She does get numbness and tingling to the extremities if she sleeps on her back or rides her bike for prolonged period.  This is go away with repositioning.  Otherwise no weakness appreciated or persistent numbness/tingling. Otherwise 12 point review of systems performed and is negative  HISTORY:  Prior history reviewed and updated per electronic medical record.  Social History   Occupational History  . Not on file  Tobacco Use  . Smoking status: Never Smoker  . Smokeless tobacco: Never Used  Substance and Sexual Activity  . Alcohol use: No  . Drug use: No  . Sexual activity: Not on file   Social History   Social History Narrative   Lives with husband.       DATA OBTAINED & REVIEWED:  Recent Labs     03/23/18 1006  CALCIUM 9.9  AST 25  ALT 18   No problems updated. No specialty comments available.  OBJECTIVE:  VS:  HT:5\' 3"  (160 cm)   WT:125 lb (56.7 kg)  BMI:22.15    BP:120/80  HR:62bpm  TEMP: ( )  RESP:97 %   PHYSICAL EXAM: Adult female.  No acute distress.  Alert and appropriate.  Good insight.  Her neck had limited cervical side bending to the right and less so to the left.  Limited cervical rotation bilaterally but this is symmetric. Negative Lhermitte's compression test and Spurling's compression test.  Right hip has good internal and external rotation.  She has minimal pain over the gluteal musculature.  There is worse pain on the right than there is on the left.  Normal gait.   ASSESSMENT   1. Cervical spondylosis with radiculopathy   2. Chronic pain syndrome   3. Pain of right hip joint       PROCEDURES:  None  PLAN:  Pertinent additional documentation may be included in corresponding procedure notes, imaging studies, problem based documentation and patient instructions.  No problem-specific Assessment & Plan notes found for this encounter.  Symptoms are consistent with underlying cervical spondylosis as outlined by prior x-rays.  She is not interested in further pharmacologic therapy and does quite well on very intermittent low-dose hydrocodone.  #60 for 90 days provided again today.  She is on  a chronic pain contract.  Intermittent dosing does not want a UDS at this time.  [place] controlled substance database checked manually in the system is the case with working.  Appropriate filling patterns. Links to [employer] provided today per Patient Instructions.  These exercises were developed by Myles Lipps, DC with a strong emphasis on core neuromuscular reducation and postural realignment through body-weight exercises.   I would like for her to continue working on home therapeutic exercises and remaining as active as possible. Activity  modifications and the importance of avoiding exacerbating activities (limiting pain to no more than a 4 / 10 during or following activity) recommended and discussed. Discussed red flag symptoms that warrant earlier emergent evaluation and patient voices understanding.  Meds ordered this encounter  Medications  . oxyCODONE-acetaminophen (PERCOCET/ROXICET) 5-325 MG tablet    Sig: Take 1 tablet by mouth every 6 (six) hours as needed for severe pain.    Dispense:  60 tablet    Refill:  0    Lab Orders  No laboratory test(s) ordered today   Imaging Orders  No imaging studies ordered today   Referral Orders  No referral(s) requested today     Return in about 3 months (around 12/28/2018).          Amanda Mews, DO    [facility]

## 2018-09-28 NOTE — Patient Instructions (Signed)

## 2018-12-10 ENCOUNTER — Encounter: Payer: Self-pay | Admitting: Sports Medicine

## 2018-12-28 ENCOUNTER — Ambulatory Visit: Payer: No Typology Code available for payment source | Admitting: Sports Medicine

## 2019-01-09 ENCOUNTER — Encounter: Payer: Self-pay | Admitting: Sports Medicine

## 2019-01-10 ENCOUNTER — Encounter: Payer: Self-pay | Admitting: Physical Therapy

## 2019-02-06 NOTE — Progress Notes (Signed)
[facility] [address] [place], [place] [address] Phone: [phone] Subjective:   I Amanda Mitchell am serving as a Neurosurgeon for Dr. Antoine Primas.  I'm seeing this patient by the request  of:    CC: Neck pain follow-up  PET:[id number]  Amanda Mitchell is a 63 y.o. female coming in with complaint of neck pain. States that she is doing well. Has had some trouble sleeping. States that she is active. Stiffness today.      Past Medical History:  Diagnosis Date  . Cervical spondylosis with radiculopathy   . Chest tightness   . Chronic migraine    Past Surgical History:  Procedure Laterality Date  . CESAREAN SECTION  1992   Social History   Socioeconomic History  . Marital status: Married    Spouse name: Not on file  . Number of children: 1  . Years of education: Not on file  . Highest education level: Not on file  Occupational History  . Not on file  Social Needs  . Financial resource strain: Not on file  . Food insecurity:    Worry: Not on file    Inability: Not on file  . Transportation needs:    Medical: Not on file    Non-medical: Not on file  Tobacco Use  . Smoking status: Never Smoker  . Smokeless tobacco: Never Used  Substance and Sexual Activity  . Alcohol use: No  . Drug use: No  . Sexual activity: Not on file  Lifestyle  . Physical activity:    Days per week: Not on file    Minutes per session: Not on file  . Stress: Not on file  Relationships  . Social connections:    Talks on phone: Not on file    Gets together: Not on file    Attends religious service: Not on file    Active member of club or organization: Not on file    Attends meetings of clubs or organizations: Not on file    Relationship status: Not on file  Other Topics Concern  . Not on file  Social History Narrative   Lives with husband.     No Known Allergies Family History  Problem Relation Age of Onset  . Congestive Heart Failure Mother 3  .  Hypertension Mother   . Cancer Maternal Grandmother        Liver  . Hypertension Maternal Grandmother   . Heart attack Maternal Grandfather 65  . Hypertension Maternal Grandfather   . Cancer Paternal Grandfather        Current Outpatient Medications (Analgesics):  .  oxyCODONE-acetaminophen (PERCOCET/ROXICET) 5-325 MG tablet, Take 1 tablet by mouth every 6 (six) hours as needed for severe pain. .  traMADol (ULTRAM) 50 MG tablet, Take 1 tablet (50 mg total) by mouth every 8 (eight) hours as needed for up to 5 days.   Current Outpatient Medications (Other):  .  calcium-vitamin D (OSCAL WITH D) 500-200 MG-UNIT tablet, Take 1 tablet by mouth. .  magnesium 30 MG tablet, Take 30 mg by mouth daily. .  Multiple Vitamin (MULTIVITAMIN) capsule, Take 1 capsule by mouth daily. Marland Kitchen  gabapentin (NEURONTIN) 100 MG capsule, Take 2 capsules (200 mg total) by mouth at bedtime.    Past medical history, social, surgical and family history all reviewed in electronic medical record.  No pertanent information unless stated regarding to the chief complaint.   Review of Systems:  No headache, visual changes, nausea, vomiting,  diarrhea, constipation, dizziness, abdominal pain, skin rash, fevers, chills, night sweats, weight loss, swollen lymph nodes, body aches, joint swelling,chest pain, shortness of breath, mood changes.  Positive muscle aches  Objective  Blood pressure 110/84, pulse 72, height 5\' 3"  (1.6 m), weight 125 lb (56.7 kg), SpO2 98 %.    General: No apparent distress alert and oriented x3 mood and affect normal, dressed appropriately.  HEENT: Pupils equal, extraocular movements intact  Respiratory: Patient's speak in full sentences and does not appear short of breath  Cardiovascular: No lower extremity edema, non tender, no erythema  Skin: Warm dry intact with no signs of infection or rash on extremities or on axial skeleton.  Abdomen: Soft nontender  Neuro: Cranial nerves II through XII are  intact, neurovascularly intact in all extremities with 2+ DTRs and 2+ pulses.  Lymph: No lymphadenopathy of posterior or anterior cervical chain or axillae bilaterally.  Gait normal with good balance and coordination.   MSK:  Non tender with full range of motion and good stability and symmetric strength and tone of shoulders, elbows, wrist, hip, knee and ankles bilaterally.  Neck: Inspection very mild loss of lordosis. No palpable stepoffs. Negative Spurling's maneuver. Mild loss of sidebending bilaterally Grip strength and sensation normal in bilateral hands Strength good C4 to T1 distribution No sensory change to C4 to T1 Negative Hoffman sign bilaterally Reflexes normal Tightness of the trapezius right greater than left     Impression and Recommendations:     This case required medical decision making of moderate complexity. The above documentation has been reviewed and is accurate and complete Judi SaaZachary M Earnestine Shipp, DO       Note: This dictation was prepared with Dragon dictation along with smaller phrase technology. Any transcriptional errors that result from this process are unintentional.

## 2019-02-07 ENCOUNTER — Ambulatory Visit (INDEPENDENT_AMBULATORY_CARE_PROVIDER_SITE_OTHER): Payer: No Typology Code available for payment source | Admitting: Family Medicine

## 2019-02-07 ENCOUNTER — Other Ambulatory Visit: Payer: Self-pay

## 2019-02-07 ENCOUNTER — Encounter: Payer: Self-pay | Admitting: Family Medicine

## 2019-02-07 DIAGNOSIS — M4722 Other spondylosis with radiculopathy, cervical region: Secondary | ICD-10-CM | POA: Diagnosis not present

## 2019-02-07 MED ORDER — GABAPENTIN 100 MG PO CAPS: 200.0000 mg | ORAL_CAPSULE | Freq: Every day | ORAL | 3 refills | Status: AC

## 2019-02-07 MED ORDER — TRAMADOL HCL 50 MG PO TABS: 50.0000 mg | ORAL_TABLET | Freq: Three times a day (TID) | ORAL | 0 refills | Status: AC | PRN

## 2019-02-07 NOTE — Assessment & Plan Note (Signed)
Cervical spondylosis is the diagnosis.  I do not have any x-rays at this time.  We discussed doing repeat x-rays with patient declined.  We discussed that we would not be refilling the oxycodone.  Patient is taking 60 every 3 months.  We discussed with her that we could consider titrating down but he did start on a very low dose of tramadol and we discussed that we only gave 5 days worth.  We will be titrating off any chronic narcotics.  We discussed multiple different medications that I think will be more beneficial.  Patient has a history of chronic headaches and we discussed over-the-counter medications and started on gabapentin.  We discussed the possibility of osteopathic manipulation is also a treatment protocol and formal physical therapy which patient did not want today.  As long as patient feels like she is making improvement she can follow-up with me again in 6 to 12 weeks.  Spent  25 minutes with patient face-to-face and had greater than 50% of counseling including as described above in assessment and plan.

## 2019-02-07 NOTE — Patient Instructions (Signed)
[place] to meet you Ice 20 minutes 2 times daily. Usually after activity and before bed. Exercises 3 times a week.  Kep hands within peripheraal vison  Breakthrough pain can do tramadol and take with a tylenol   Over the counter recommend  Vitamin D 2000 IU daily  Turmeric 500mg  daily  CoQ10 400mg  daily

## 2019-03-21 ENCOUNTER — Ambulatory Visit: Payer: No Typology Code available for payment source | Admitting: Family Medicine
